# Patient Record
Sex: Female | Born: 1985
Health system: Southern US, Community
[De-identification: ages and names within clinical notes are randomized; demographics above are authoritative.]

## PROBLEM LIST (undated history)

## (undated) DIAGNOSIS — E669 Obesity, unspecified: Secondary | ICD-10-CM

## (undated) DIAGNOSIS — T4145XA Adverse effect of unspecified anesthetic, initial encounter: Secondary | ICD-10-CM

## (undated) DIAGNOSIS — T8859XA Other complications of anesthesia, initial encounter: Secondary | ICD-10-CM

## (undated) DIAGNOSIS — R21 Rash and other nonspecific skin eruption: Secondary | ICD-10-CM

## (undated) DIAGNOSIS — J45909 Unspecified asthma, uncomplicated: Secondary | ICD-10-CM

## (undated) HISTORY — PX: CHOLECYSTECTOMY: SHX55

## (undated) HISTORY — DX: Obesity, unspecified: E66.9

---

## 2006-11-06 ENCOUNTER — Observation Stay: Payer: Self-pay | Admitting: Obstetrics & Gynecology

## 2007-03-20 ENCOUNTER — Inpatient Hospital Stay: Payer: Self-pay | Admitting: Unknown Physician Specialty

## 2007-07-29 ENCOUNTER — Emergency Department: Payer: Self-pay | Admitting: Internal Medicine

## 2007-11-11 ENCOUNTER — Emergency Department: Payer: Self-pay

## 2008-11-03 ENCOUNTER — Emergency Department: Payer: Self-pay | Admitting: Emergency Medicine

## 2009-10-10 ENCOUNTER — Ambulatory Visit: Payer: Self-pay | Admitting: Family Medicine

## 2010-01-03 ENCOUNTER — Observation Stay: Payer: Self-pay | Admitting: Obstetrics and Gynecology

## 2010-04-03 ENCOUNTER — Observation Stay: Payer: Self-pay | Admitting: Obstetrics and Gynecology

## 2010-04-07 IMAGING — US US OB < 14 WEEKS
1 series · 17 of 19 positions shown · non-contrast
Comparison: none

REASON FOR EXAM: viability   dating   CALL  [id number]
COMMENTS:

[Series 1: us ob < 14 weeks · 17 of 19 slices shown]
[im 1/19]
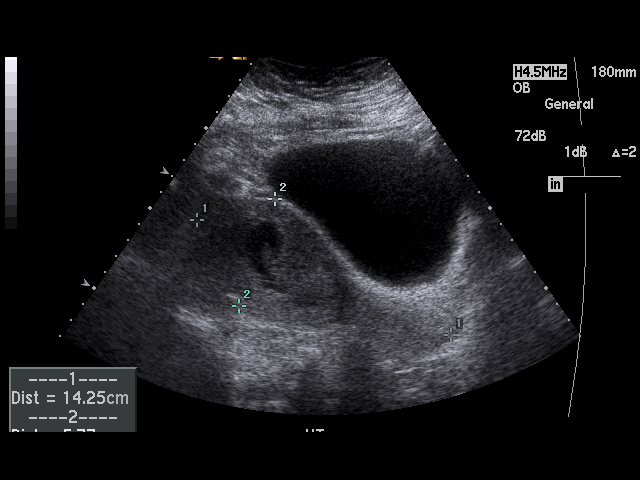
[im 2/19]
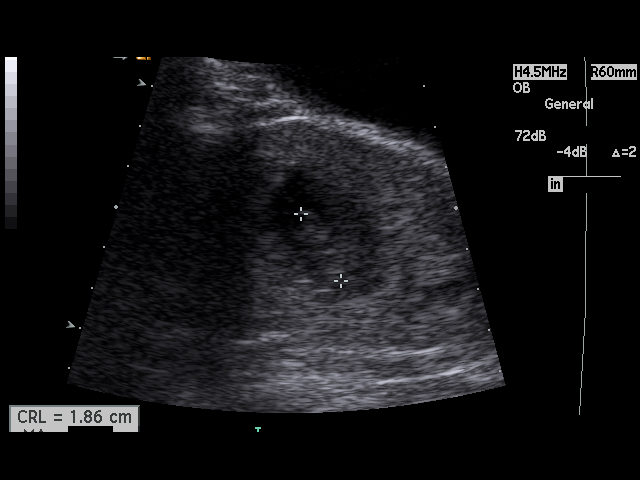
[im 3/19]
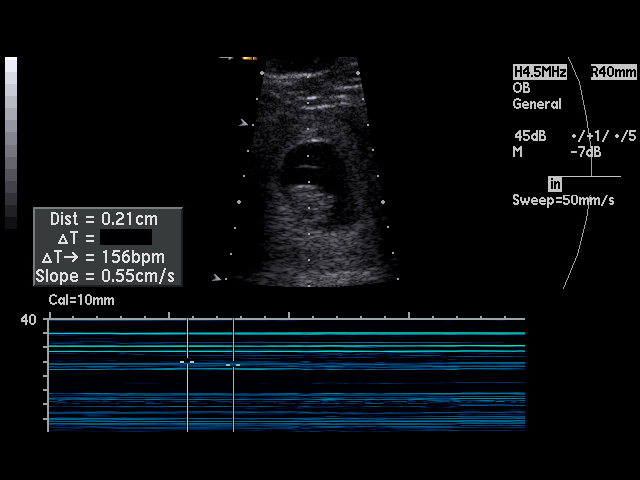
[im 4/19]
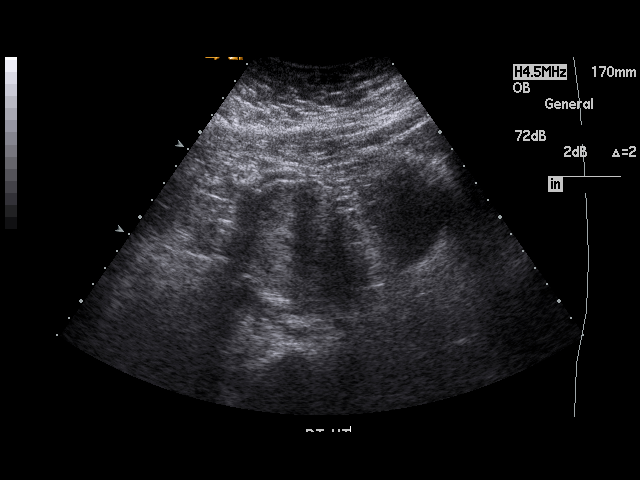
[im 6/19]
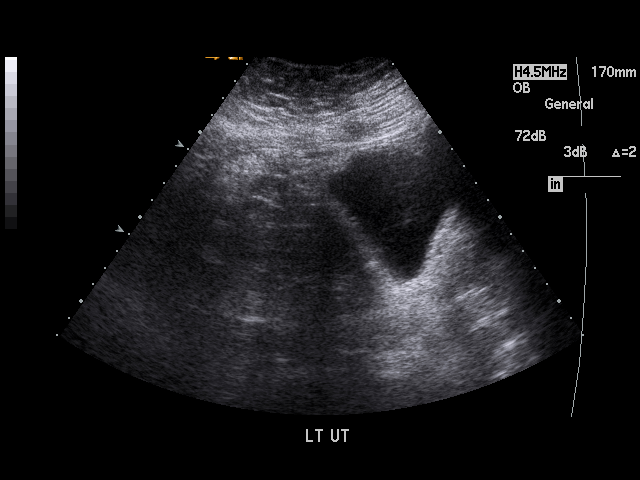
[im 7/19]
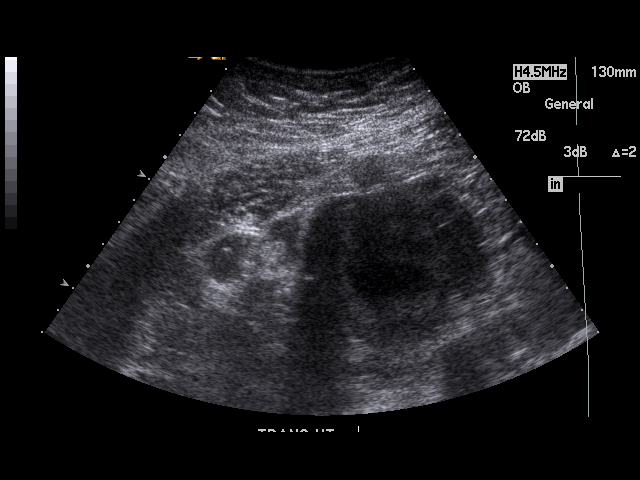
[im 8/19]
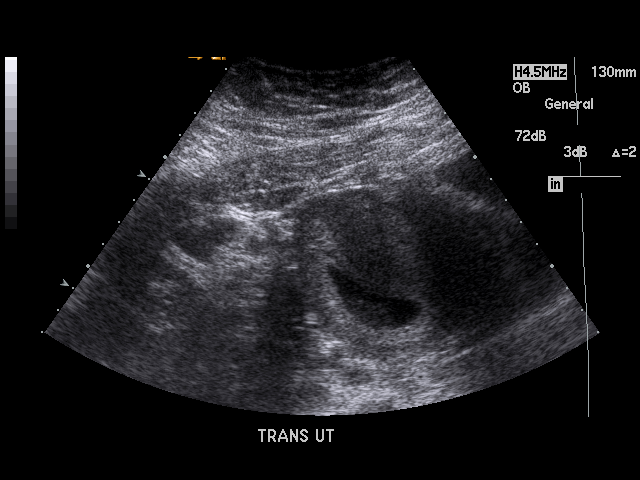
[im 9/19]
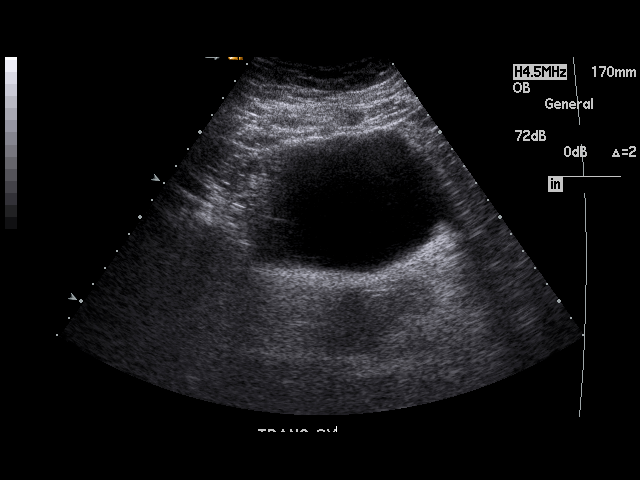
[im 10/19]
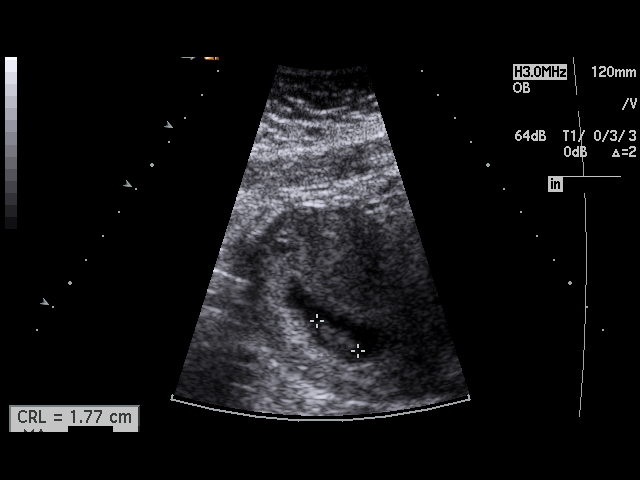
[im 11/19]
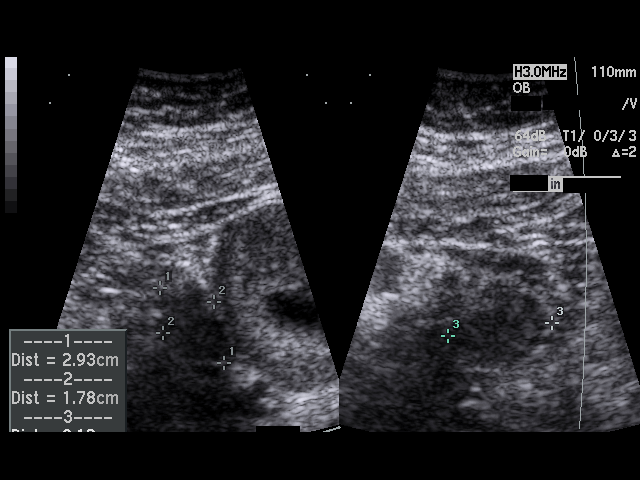
[im 12/19]
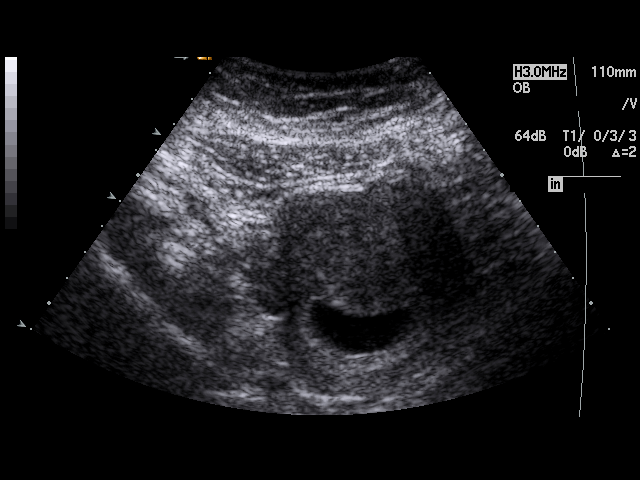
[im 13/19]
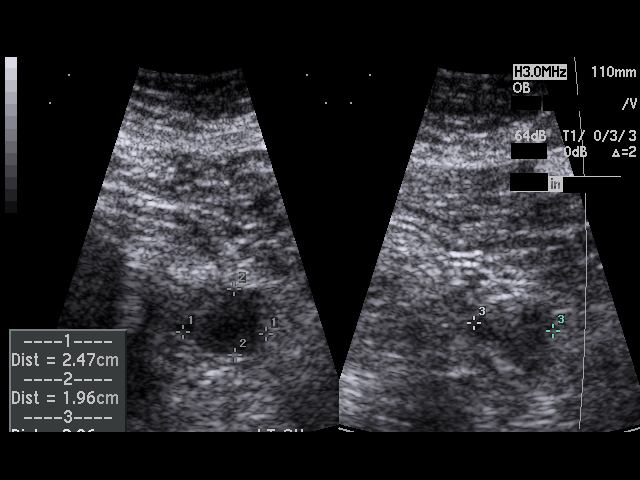
[im 14/19]
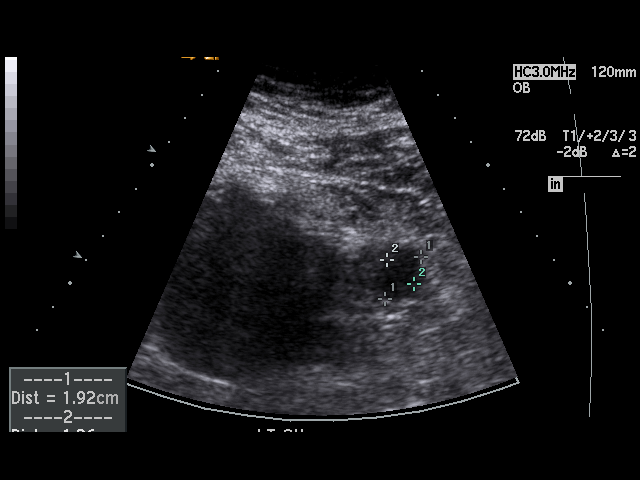
[im 16/19]
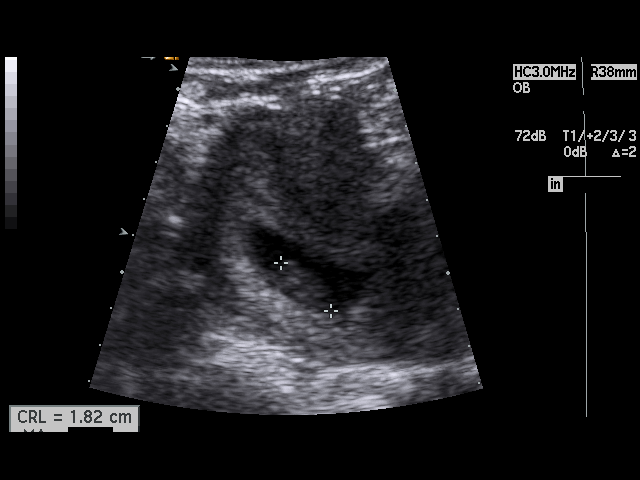
[im 17/19]
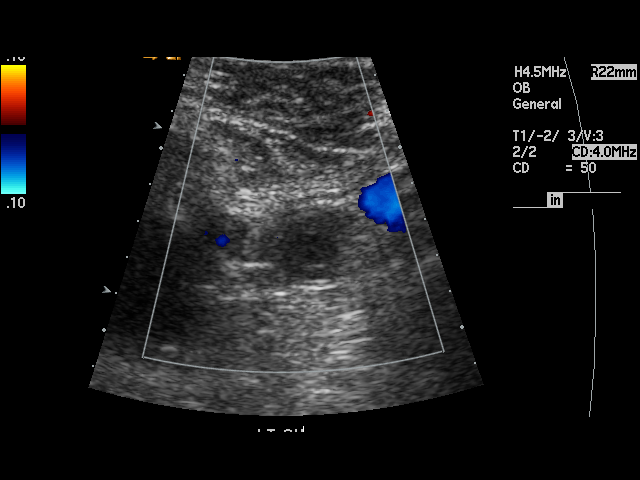
[im 18/19]
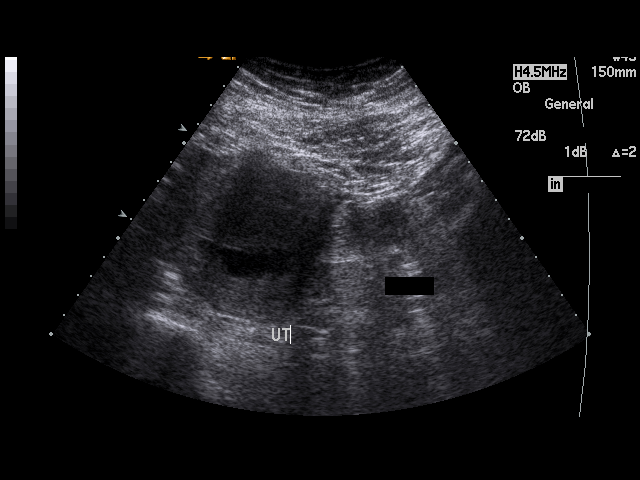
[im 19/19]
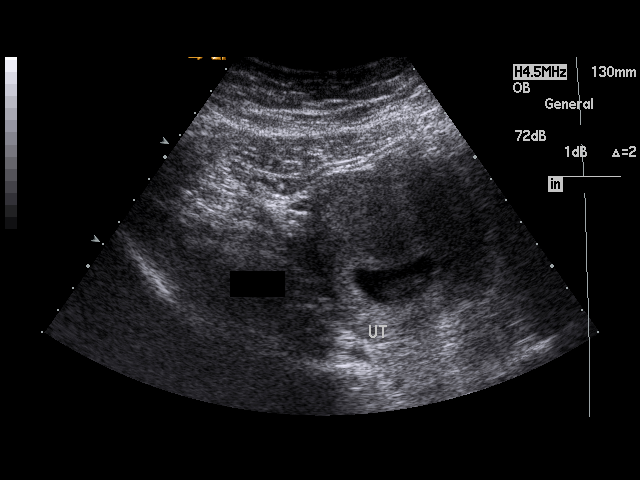

[17 of 19 positions shown; findings below may reference images not displayed]

PROCEDURE:     US  - US OB LESS THAN 14 WEEKS  - October 10, 2009  [DATE]

RESULT:

An intrauterine gestational sac is appreciated. A fetal pole is identified
with an estimated fetal heart rate of 156 beats per minute. Crown-rump
length is 1.82 cm which corresponds to an estimated gestational age of 8
weeks-4 days. EDC per ultrasound is 04/20/2010. The uterus otherwise
demonstrates a homogeneous echotexture. Evaluation of the ovaries
demonstrates no sonographic abnormalities. There is no evidence of pelvic
free fluid, loculated fluid collections nor masses.
IMPRESSION: 1. Single viable intrauterine pregnancy as described above.

Thank you for the opportunity to contribute to the care of your patient.

ADDENDUM:  11-20-09

Corrected EDC per ultrasound is 05/20/10

## 2010-04-16 ENCOUNTER — Observation Stay: Payer: Self-pay

## 2010-05-12 ENCOUNTER — Ambulatory Visit: Payer: Self-pay | Admitting: Obstetrics and Gynecology

## 2010-05-13 ENCOUNTER — Inpatient Hospital Stay: Payer: Self-pay | Admitting: Obstetrics and Gynecology

## 2010-07-25 LAB — HM PAP SMEAR: HM Pap smear: NORMAL

## 2012-07-09 ENCOUNTER — Emergency Department: Payer: Self-pay | Admitting: Unknown Physician Specialty

## 2012-07-09 LAB — COMPREHENSIVE METABOLIC PANEL
Anion Gap: 9 (ref 7–16)
Bilirubin,Total: 0.3 mg/dL (ref 0.2–1.0)
Calcium, Total: 9.1 mg/dL (ref 8.5–10.1)
Chloride: 107 mmol/L (ref 98–107)
Co2: 24 mmol/L (ref 21–32)
Creatinine: 0.72 mg/dL (ref 0.60–1.30)
EGFR (African American): >60
EGFR (Non-African Amer.): >60
Osmolality: 280 (ref 275–301)
Potassium: 3.8 mmol/L (ref 3.5–5.1)
SGPT (ALT): 44 U/L (ref 12–78)
Sodium: 140 mmol/L (ref 136–145)
Total Protein: 7.3 g/dL (ref 6.4–8.2)

## 2012-07-09 LAB — CBC
HCT: 41.8 % (ref 35.0–47.0)
HGB: 14.4 g/dL (ref 12.0–16.0)
MCH: 29.4 pg (ref 26.0–34.0)
MCHC: 34.5 g/dL (ref 32.0–36.0)
RBC: 4.91 10*6/uL (ref 3.80–5.20)

## 2012-07-09 LAB — LIPASE, BLOOD: Lipase: 282 U/L (ref 73–393)

## 2012-07-13 ENCOUNTER — Ambulatory Visit: Payer: Self-pay | Admitting: Surgery

## 2012-07-13 LAB — PREGNANCY, URINE: Pregnancy Test, Urine: NEGATIVE m[IU]/mL

## 2012-07-17 LAB — PATHOLOGY REPORT

## 2013-01-04 IMAGING — US ABDOMEN ULTRASOUND LIMITED
1 series · 13 of 25 positions shown · non-contrast
Comparison: none

REASON FOR EXAM: RUQ/EPIGASTRIC PAIN
COMMENTS:   Body Site: GB and Fossa, CBD, Head of Pancreas

[Series 1: abdomen ultrasound limited · 0.30mm/px · 13 of 47 slices shown]
[im 1/47]
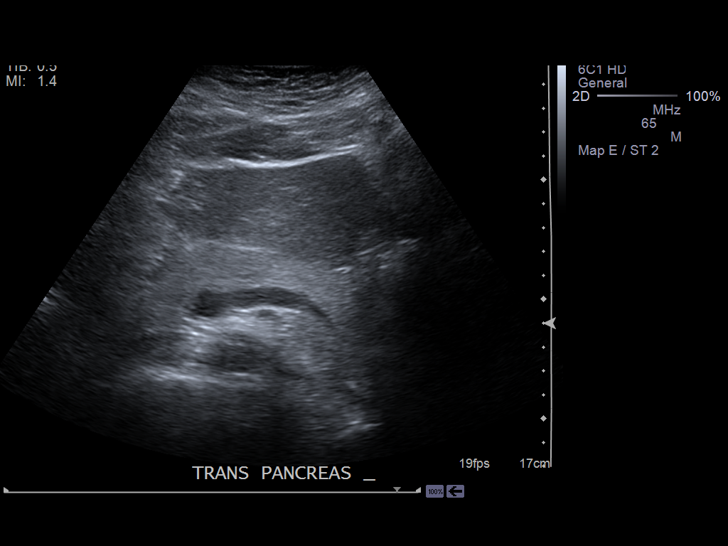
[im 4/47]
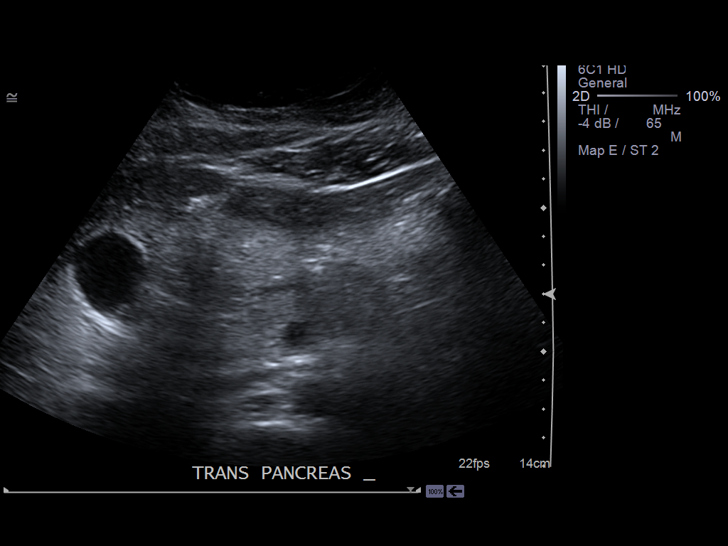
[im 8/47]
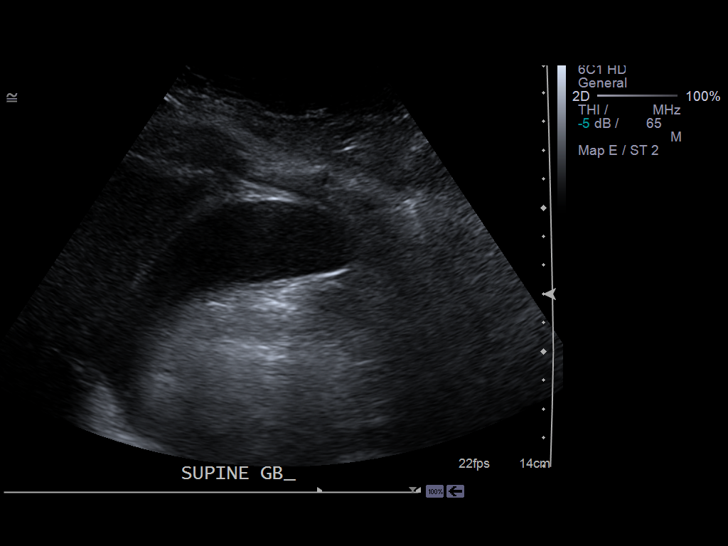
[im 12/47]
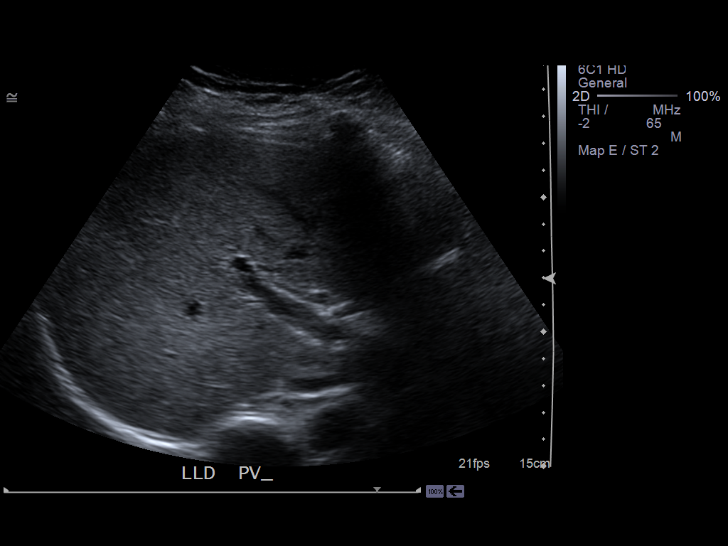
[im 16/47]
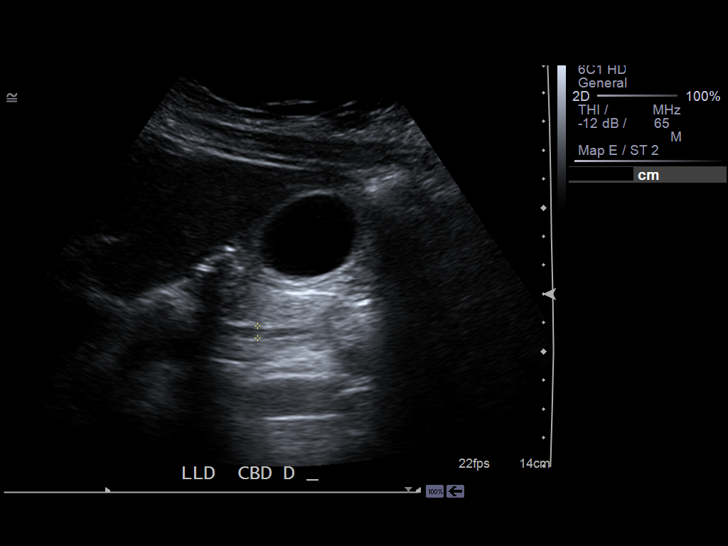
[im 20/47]
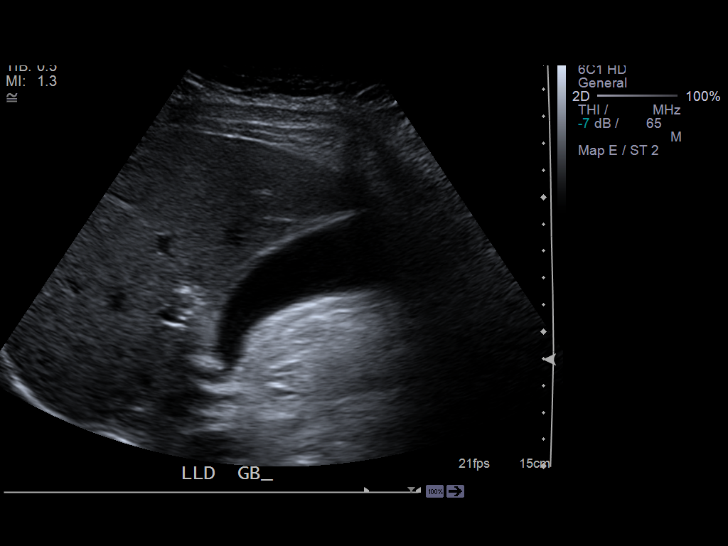
[im 24/47]
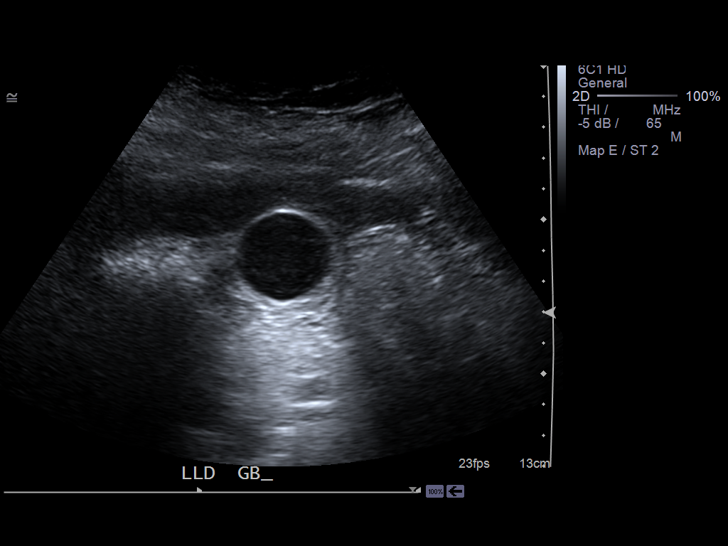
[im 27/47]
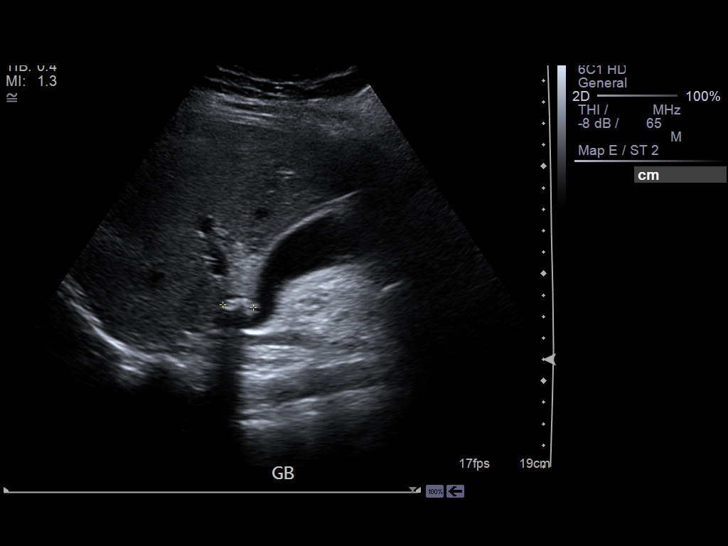
[im 31/47]
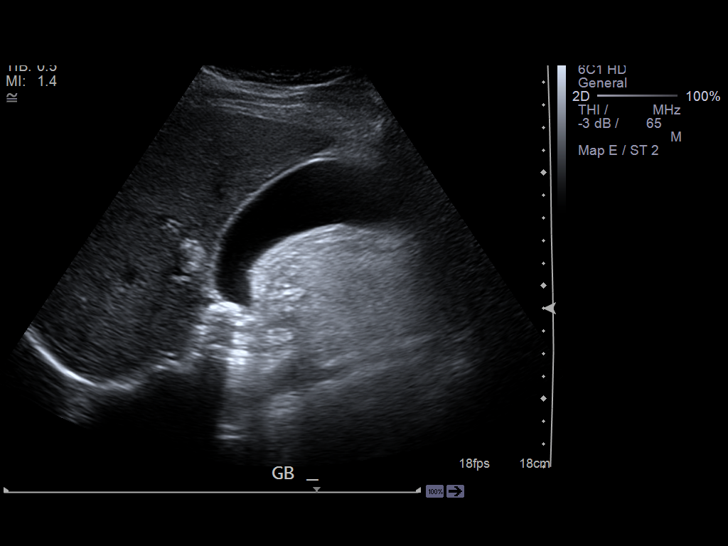
[im 35/47]
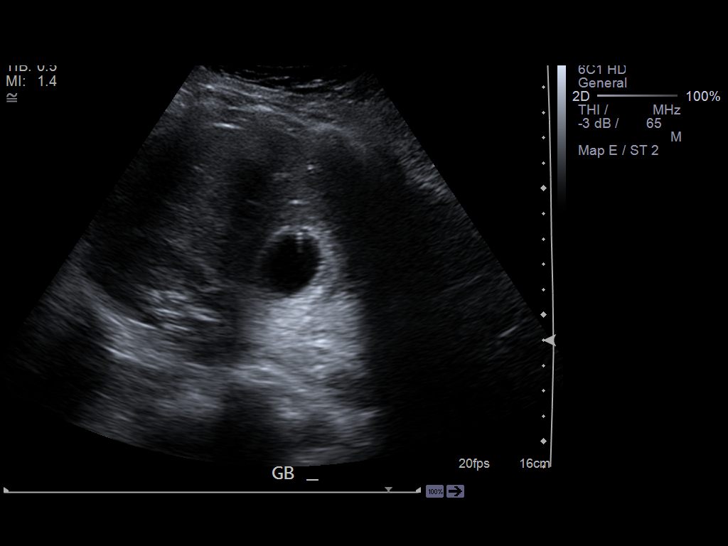
[im 39/47]
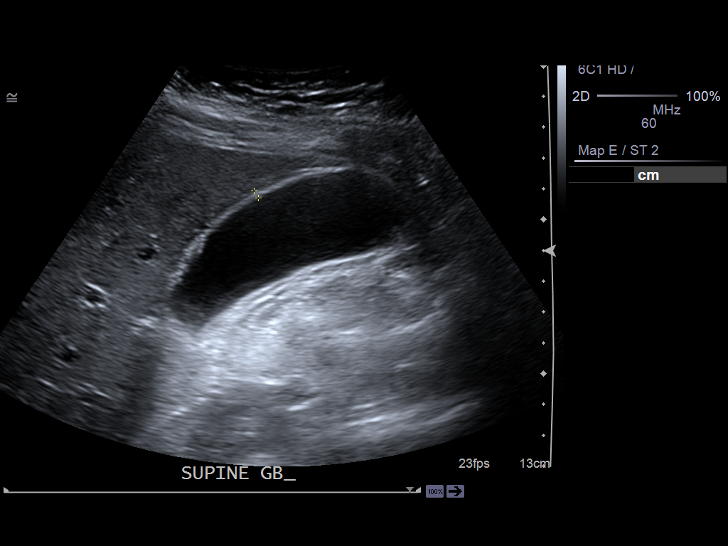
[im 43/47]
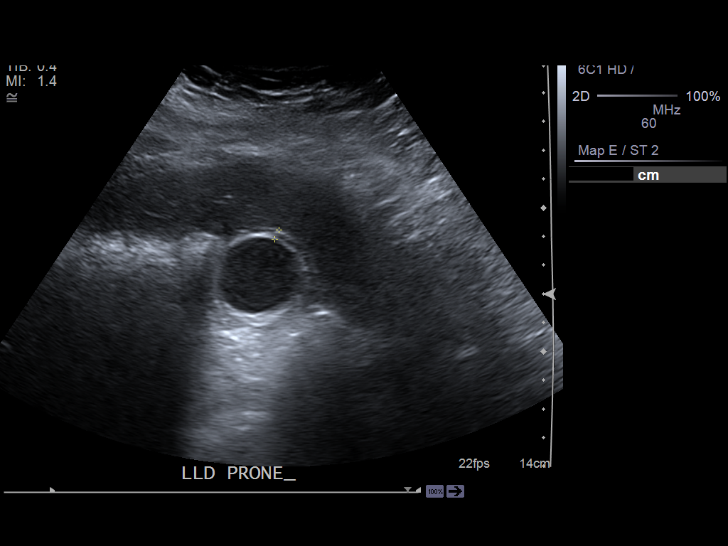
[im 47/47]
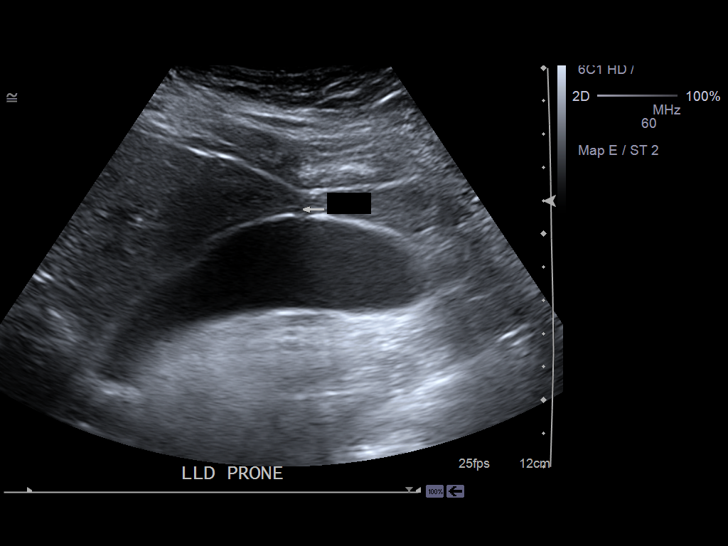

[13 of 25 positions shown; findings below may reference images not displayed]

PROCEDURE:     US  - US ABDOMEN LIMITED SURVEY  - July 09, 2012  [DATE]

RESULT:     A limited right upper quadrant ultrasound was performed.

The gallbladder is adequately distended. There are nonmobile gallstones
demonstrated. A 1.4 cm stone is demonstrated in the gallbladder neck. There
is borderline gallbladder wall thickening and there is a trace of
pericholecystic fluid. A sonographic Murphy's sign could not be assessed due
to the patient's medicated status.

The common bile duct is normal at 4.5 mm. The pancreatic head appears
normal. The pancreatic duct is visible but not abnormally distended at under
4 mm. The observed portions of the liver are normal. Portal venous flow is
normal in direction toward the liver.
IMPRESSION: 1. There are multiple nonmobile stones present. A stone is lodged in the
gallbladder neck. There is mild gallbladder wall thickening and likely a
trace of pericholecystic fluid.
2. The common bile duct and pancreas are normal where visualized.

A preliminary report was called to the [HOSPITAL] the
conclusion of the study.

[REDACTED]

## 2014-07-25 ENCOUNTER — Ambulatory Visit (INDEPENDENT_AMBULATORY_CARE_PROVIDER_SITE_OTHER): Payer: Commercial Managed Care - PPO | Admitting: Family Medicine

## 2014-07-25 ENCOUNTER — Encounter: Payer: Self-pay | Admitting: Family Medicine

## 2014-07-25 VITALS — BP 120/84 | HR 92 | Temp 98.1°F | Resp 16 | Ht 67.25 in | Wt 241.0 lb

## 2014-07-25 DIAGNOSIS — R059 Cough, unspecified: Secondary | ICD-10-CM

## 2014-07-25 DIAGNOSIS — M722 Plantar fascial fibromatosis: Secondary | ICD-10-CM

## 2014-07-25 DIAGNOSIS — R058 Other specified cough: Secondary | ICD-10-CM | POA: Insufficient documentation

## 2014-07-25 DIAGNOSIS — E669 Obesity, unspecified: Secondary | ICD-10-CM | POA: Insufficient documentation

## 2014-07-25 DIAGNOSIS — R05 Cough: Secondary | ICD-10-CM

## 2014-07-25 MED ORDER — MELOXICAM 15 MG PO TABS: 15.0000 mg | ORAL_TABLET | Freq: Every day | ORAL | Status: DC

## 2014-07-25 MED ORDER — ALBUTEROL SULFATE HFA 108 (90 BASE) MCG/ACT IN AERS: 2.0000 | INHALATION_SPRAY | Freq: Four times a day (QID) | RESPIRATORY_TRACT | Status: DC | PRN

## 2014-07-25 MED ORDER — ALBUTEROL SULFATE (2.5 MG/3ML) 0.083% IN NEBU
2.5000 mg | INHALATION_SOLUTION | Freq: Once | RESPIRATORY_TRACT | Status: AC
  Administered 2014-07-25: 2.5 mg via RESPIRATORY_TRACT

## 2014-07-25 NOTE — Patient Instructions (Signed)
Schedule your complete physical at your convenience- we can do a pap at this time Start Claritin or Zyrtec daily- store brand works just as well- to decrease post nasal drip and airway inflammation Use the inhaler- 2 puffs every 6 hrs- as needed for cough, wheezing, shortness of breath Start the Mobic daily- w/ food- x10 days for inflammation and then as needed Roll your feet over the frozen water bottle daily to improve inflammation and stretch the arch Wear good supportive shoes Try and make healthy food choices and get regular activity to prevent additional weight gain Call with any questions or concerns Welcome!  We're glad to have you!

## 2014-07-25 NOTE — Progress Notes (Signed)
Pre visit review using our clinic review tool, if applicable. No additional management support is needed unless otherwise documented below in the visit note. 

## 2014-07-25 NOTE — Progress Notes (Signed)
   Subjective:    Patient ID: [name] Johnston, female    DOB: Sep 18, 1986, 28 y.o.   MRN: [id number]  HPI CPE- no previous PCP.  Last pap ~4 yrs ago.  Cough- sxs started 3 weeks ago.  'feels like i'm coughing up cotton balls'.  Intermittently productive.  No fevers.  'other than the cough, it's like I'm not sick at all'.  No known sick contacts.  No hx of seasonal allergies.  Bilaterally foot pain- most noticeable after prolonged sitting.  Painful to stand.  Pt reports feet will 'swell and turn purple'.  Discomfort is diffuse.  sxs started ~1 yr ago.  Birth control- 8/14 had Implanon switched from R arm to L arm.  Since then reports increased weight gain, 'i've gained 50 lbs' and irregular pelvic cramping and pain.  Not having periods w/ Implanon.   Review of Systems For ROS see HPI     Objective:   Physical Exam  Vitals reviewed. Constitutional: She is oriented to person, place, and time. She appears well-developed and well-nourished. No distress.  HENT:  Head: Normocephalic and atraumatic.  Right Ear: Tympanic membrane normal.  Left Ear: Tympanic membrane normal.  Nose: Mucosal edema and rhinorrhea present. Right sinus exhibits no maxillary sinus tenderness and no frontal sinus tenderness. Left sinus exhibits no maxillary sinus tenderness and no frontal sinus tenderness.  Mouth/Throat: Mucous membranes are normal. Posterior oropharyngeal erythema (w/ PND) present.  Eyes: Conjunctivae and EOM are normal. Pupils are equal, round, and reactive to light.  Neck: Normal range of motion. Neck supple.  Cardiovascular: Normal rate, regular rhythm and normal heart sounds.   Pulmonary/Chest: Effort normal and breath sounds normal. No respiratory distress. She has no wheezes. She has no rales.  Musculoskeletal: She exhibits tenderness (TTP over insertion of plantar fascia bilaterally). She exhibits no edema.  Lymphadenopathy:    She has no cervical adenopathy.  Neurological: She is alert and  oriented to person, place, and time. She has normal reflexes.  Skin: Skin is warm and dry.  Psychiatric: She has a normal mood and affect. Her behavior is normal.          Assessment & Plan:

## 2014-07-28 NOTE — Assessment & Plan Note (Addendum)
New.  Suspect this is due to untreated nasal allergies.  Cough improved s/p neb tx- consistent w/ RAD.  Start OTC antihistamine.  Albuterol inhaler prn.  Reviewed supportive care and red flags that should prompt return.  Pt expressed understanding and is in agreement w/ plan.

## 2014-07-28 NOTE — Assessment & Plan Note (Signed)
New.  Start scheduled NSAIDs.  Ice and stretching as shown.  Reviewed supportive care and red flags that should prompt return.  Pt expressed understanding and is in agreement w/ plan.

## 2014-07-28 NOTE — Assessment & Plan Note (Signed)
New.  Encouraged regular, aerobic activity for 30 minutes at least 4x/week and monitoring caloric intake w/ the help of [employer]itness[employer] app. Check labs to risk stratify.  Will follow.

## 2014-08-22 ENCOUNTER — Ambulatory Visit: Payer: Self-pay | Admitting: Family Medicine

## 2014-11-20 ENCOUNTER — Other Ambulatory Visit (HOSPITAL_COMMUNITY)
Admission: RE | Admit: 2014-11-20 | Discharge: 2014-11-20 | Disposition: A | Discharge status: Home / Self Care | Payer: Commercial Managed Care - PPO | Source: Ambulatory Visit | Attending: Family Medicine | Admitting: Family Medicine

## 2014-11-20 ENCOUNTER — Ambulatory Visit (INDEPENDENT_AMBULATORY_CARE_PROVIDER_SITE_OTHER): Payer: Commercial Managed Care - PPO | Admitting: Family Medicine

## 2014-11-20 ENCOUNTER — Encounter: Payer: Self-pay | Admitting: Family Medicine

## 2014-11-20 VITALS — BP 120/82 | HR 89 | Temp 98.0°F | Resp 16 | Ht 67.0 in | Wt 249.4 lb

## 2014-11-20 DIAGNOSIS — Z01411 Encounter for gynecological examination (general) (routine) with abnormal findings: Secondary | ICD-10-CM | POA: Insufficient documentation

## 2014-11-20 DIAGNOSIS — Z01419 Encounter for gynecological examination (general) (routine) without abnormal findings: Secondary | ICD-10-CM

## 2014-11-20 DIAGNOSIS — Z1151 Encounter for screening for human papillomavirus (HPV): Secondary | ICD-10-CM | POA: Diagnosis present

## 2014-11-20 DIAGNOSIS — Z124 Encounter for screening for malignant neoplasm of cervix: Secondary | ICD-10-CM

## 2014-11-20 DIAGNOSIS — M25579 Pain in unspecified ankle and joints of unspecified foot: Secondary | ICD-10-CM

## 2014-11-20 NOTE — Patient Instructions (Signed)
Follow up in 1 year or as needed We'll notify you of your lab results and make any changes if needed Try and make healthy food choices and get regular exercise We'll call you with Sports Med appt Call with any questions or concerns Happy New Year!!!

## 2014-11-20 NOTE — Progress Notes (Signed)
Pre visit review using our clinic review tool, if applicable. No additional management support is needed unless otherwise documented below in the visit note. 

## 2014-11-20 NOTE — Progress Notes (Signed)
   Subjective:    Patient ID: [name] DaubBetsy R Johnston, female    DOB: 1986-06-05, 29 y.o.   MRN: [id number]  HPI CPE- due for pap.  Foot/ankle pain- pt is unable to report which ankle but intermittently will have episodes where she will step down and 'it feels like my ankle is going to crack in half'.  Audible pop/crack w/ each step when pt is having episode.  Will spontaneously resolve and then return w/o injury or inciting incident.   Review of Systems Patient reports no vision/ hearing changes, adenopathy,fever, weight change,  persistant/recurrent hoarseness , swallowing issues, chest pain, palpitations, edema, persistant/recurrent cough, hemoptysis, dyspnea (rest/exertional/paroxysmal nocturnal), gastrointestinal bleeding (melena, rectal bleeding), abdominal pain, significant heartburn, bowel changes, GU symptoms (dysuria, hematuria, incontinence), Gyn symptoms (abnormal  bleeding, pain),  syncope, focal weakness, memory loss, numbness & tingling, skin/hair/nail changes, abnormal bruising or bleeding, anxiety, or depression.     Objective:   Physical Exam  General Appearance:    Alert, cooperative, no distress, appears stated age, obese  Head:    Normocephalic, without obvious abnormality, atraumatic  Eyes:    PERRL, conjunctiva/corneas clear, EOM's intact, fundi    benign, both eyes  Ears:    Normal TM's and external ear canals, both ears  Nose:   Nares normal, septum midline, mucosa normal, no drainage    or sinus tenderness  Throat:   Lips, mucosa, and tongue normal; teeth and gums normal  Neck:   Supple, symmetrical, trachea midline, no adenopathy;    Thyroid: no enlargement/tenderness/nodules  Back:     Symmetric, no curvature, ROM normal, no CVA tenderness  Lungs:     Clear to auscultation bilaterally, respirations unlabored  Chest Wall:    No tenderness or deformity   Heart:    Regular rate and rhythm, S1 and S2 normal, no murmur, rub   or gallop  Breast Exam:    No tenderness,  masses, or nipple abnormality  Abdomen:     Soft, non-tender, bowel sounds active all four quadrants,    no masses, no organomegaly  Genitalia:    External genitalia normal, cervix normal in appearance, no CMT, uterus in normal size and position, adnexa w/out mass or tenderness, mucosa pink and moist, no lesions or discharge present  Rectal:    Normal external appearance  Extremities:   Extremities normal, atraumatic, no cyanosis or edema  Pulses:   2+ and symmetric all extremities  Skin:   Skin color, texture, turgor normal, no rashes or lesions  Lymph nodes:   Cervical, supraclavicular, and axillary nodes normal  Neurologic:   CNII-XII intact, normal strength, sensation and reflexes    throughout          Assessment & Plan:

## 2014-11-21 DIAGNOSIS — M25579 Pain in unspecified ankle and joints of unspecified foot: Secondary | ICD-10-CM | POA: Insufficient documentation

## 2014-11-21 LAB — LIPID PANEL
Cholesterol: 182 mg/dL (ref 0–200)
HDL: 46 mg/dL (ref >39.00)
LDL CALC: 102 mg/dL — AB (ref 0–99)
NONHDL: 136
TRIGLYCERIDES: 172 mg/dL — AB (ref 0.0–149.0)
Total CHOL/HDL Ratio: 4
VLDL: 34.4 mg/dL (ref 0.0–40.0)

## 2014-11-21 LAB — CBC WITH DIFFERENTIAL/PLATELET
BASOS PCT: 2.1 % (ref 0.0–3.0)
Basophils Absolute: 0.2 10*3/uL — ABNORMAL HIGH (ref 0.0–0.1)
Eosinophils Absolute: 0.1 10*3/uL (ref 0.0–0.7)
Eosinophils Relative: 0.9 % (ref 0.0–5.0)
HCT: 42.5 % (ref 36.0–46.0)
Hemoglobin: 14.4 g/dL (ref 12.0–15.0)
LYMPHS PCT: 34.7 % (ref 12.0–46.0)
Lymphs Abs: 3 10*3/uL (ref 0.7–4.0)
MCHC: 33.9 g/dL (ref 30.0–36.0)
MCV: 84.7 fl (ref 78.0–100.0)
MONO ABS: 0.5 10*3/uL (ref 0.1–1.0)
Monocytes Relative: 5.6 % (ref 3.0–12.0)
Neutro Abs: 5 10*3/uL (ref 1.4–7.7)
Neutrophils Relative %: 56.7 % (ref 43.0–77.0)
Platelets: 429 10*3/uL — ABNORMAL HIGH (ref 150.0–400.0)
RBC: 5.02 Mil/uL (ref 3.87–5.11)
RDW: 13.6 % (ref 11.5–15.5)
WBC: 8.7 10*3/uL (ref 4.0–10.5)

## 2014-11-21 LAB — BASIC METABOLIC PANEL
BUN: 12 mg/dL (ref 6–23)
CALCIUM: 9.9 mg/dL (ref 8.4–10.5)
CO2: 18 mEq/L — ABNORMAL LOW (ref 19–32)
CREATININE: 0.76 mg/dL (ref 0.40–1.20)
Chloride: 109 mEq/L (ref 96–112)
GFR: 95.78 mL/min (ref >60.00)
Glucose, Bld: 86 mg/dL (ref 70–99)
Potassium: 6 mEq/L — ABNORMAL HIGH (ref 3.5–5.1)
SODIUM: 139 meq/L (ref 135–145)

## 2014-11-21 LAB — HEPATIC FUNCTION PANEL
ALBUMIN: 4.2 g/dL (ref 3.5–5.2)
ALT: 53 U/L — ABNORMAL HIGH (ref 0–35)
AST: 47 U/L — ABNORMAL HIGH (ref 0–37)
Alkaline Phosphatase: 99 U/L (ref 39–117)
BILIRUBIN DIRECT: 0.1 mg/dL (ref 0.0–0.3)
BILIRUBIN TOTAL: 0.4 mg/dL (ref 0.2–1.2)
TOTAL PROTEIN: 7.6 g/dL (ref 6.0–8.3)

## 2014-11-21 LAB — VITAMIN D 25 HYDROXY (VIT D DEFICIENCY, FRACTURES): VITD: 34.14 ng/mL (ref 30.00–100.00)

## 2014-11-21 LAB — TSH: TSH: 1.05 u[IU]/mL (ref 0.35–4.50)

## 2014-11-21 NOTE — Assessment & Plan Note (Signed)
Pap collected. 

## 2014-11-21 NOTE — Assessment & Plan Note (Signed)
New.  Pt's sxs are intermittent but painful and debilitating when they occur.  Will refer to sports med.  Pt expressed understanding and is in agreement w/ plan.

## 2014-11-21 NOTE — Assessment & Plan Note (Signed)
Pt's PE WNL w/ exception of obesity.  Check labs.  Anticipatory guidance provided.  

## 2014-11-26 ENCOUNTER — Other Ambulatory Visit: Payer: Self-pay | Admitting: General Practice

## 2014-11-26 ENCOUNTER — Ambulatory Visit: Payer: Commercial Managed Care - PPO | Admitting: Family Medicine

## 2014-11-26 LAB — CYTOLOGY - PAP

## 2014-11-26 MED ORDER — FLUCONAZOLE 150 MG PO TABS
150.0000 mg | ORAL_TABLET | Freq: Once | ORAL | Status: DC
Start: 2014-11-26 — End: 2015-09-05

## 2014-11-29 ENCOUNTER — Encounter: Payer: Self-pay | Admitting: General Practice

## 2014-11-29 ENCOUNTER — Other Ambulatory Visit: Payer: Self-pay | Admitting: General Practice

## 2014-11-29 DIAGNOSIS — E875 Hyperkalemia: Secondary | ICD-10-CM

## 2014-11-29 DIAGNOSIS — R945 Abnormal results of liver function studies: Principal | ICD-10-CM

## 2014-11-29 DIAGNOSIS — R7989 Other specified abnormal findings of blood chemistry: Secondary | ICD-10-CM

## 2014-12-16 ENCOUNTER — Other Ambulatory Visit: Payer: Commercial Managed Care - PPO

## 2014-12-17 ENCOUNTER — Other Ambulatory Visit: Payer: Commercial Managed Care - PPO

## 2014-12-19 ENCOUNTER — Other Ambulatory Visit (INDEPENDENT_AMBULATORY_CARE_PROVIDER_SITE_OTHER): Payer: Commercial Managed Care - PPO

## 2014-12-19 ENCOUNTER — Encounter: Payer: Self-pay | Admitting: General Practice

## 2014-12-19 DIAGNOSIS — R945 Abnormal results of liver function studies: Principal | ICD-10-CM

## 2014-12-19 DIAGNOSIS — R7989 Other specified abnormal findings of blood chemistry: Secondary | ICD-10-CM

## 2014-12-19 DIAGNOSIS — E875 Hyperkalemia: Secondary | ICD-10-CM

## 2014-12-19 LAB — BASIC METABOLIC PANEL
BUN: 12 mg/dL (ref 6–23)
CO2: 22 mEq/L (ref 19–32)
Calcium: 9.4 mg/dL (ref 8.4–10.5)
Chloride: 107 mEq/L (ref 96–112)
Creatinine, Ser: 0.72 mg/dL (ref 0.40–1.20)
GFR: 101.89 mL/min (ref >60.00)
GLUCOSE: 91 mg/dL (ref 70–99)
Potassium: 4 mEq/L (ref 3.5–5.1)
SODIUM: 136 meq/L (ref 135–145)

## 2014-12-19 LAB — HEPATIC FUNCTION PANEL
ALT: 38 U/L — AB (ref 0–35)
AST: 18 U/L (ref 0–37)
Albumin: 3.8 g/dL (ref 3.5–5.2)
Alkaline Phosphatase: 97 U/L (ref 39–117)
BILIRUBIN DIRECT: 0.1 mg/dL (ref 0.0–0.3)
Total Bilirubin: 0.5 mg/dL (ref 0.2–1.2)
Total Protein: 7.2 g/dL (ref 6.0–8.3)

## 2015-02-18 NOTE — H&P (Signed)
PATIENT NAME:  [name] Johnston, [name] MR#:  [id number] DATE OF BIRTH:  17-Dec-1985  DATE OF ADMISSION:  07/13/2012  CHIEF COMPLAINT: Right upper quadrant pain.   HISTORY OF PRESENT ILLNESS: This is a patient who was recently seen in the Emergency Room with recurrent episodes of right upper quadrant pain associated with fatty food intolerance. Workup showed gallstones and likely biliary colic. She is here for elective laparoscopic cholecystectomy for control of her symptoms.   PAST MEDICAL HISTORY: None.   PAST SURGICAL HISTORY: Cesarean sections.   ALLERGIES: No known drug allergies.   MEDICATIONS: None.   FAMILY HISTORY: She has a sister who had a cholecystectomy recently.   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 She does not smoke or drink.   REVIEW OF SYSTEMS: Ten system review has been performed and negative with the exception of that mentioned in the history of present illness.   PHYSICAL EXAMINATION:   GENERAL: Healthy but obese female patient.   HEENT: No scleral icterus.   NECK: No palpable neck nodes.   CHEST: Clear to auscultation.   CARDIAC: Regular rate and rhythm.   ABDOMEN: Soft, nontender.   EXTREMITIES: No edema. Calves are nontender.   NEUROLOGIC: Grossly intact.   INTEGUMENT: No jaundice.   LABS/RADIOLOGIC STUDIES: Laboratory values are within normal limits. No liver function test elevation.   Ultrasound shows stones.   ASSESSMENT AND PLAN: This is a patient with symptomatic cholelithiasis here for elective laparoscopic cholecystectomy. The rationale for surgery has been discussed. The options of observation have been reviewed and the risks of bleeding, infection, recurrence of symptoms, failure to resolve her symptoms, open procedure, bile duct damage, bile duct leak, and retained common bile duct stone any of which could require further surgery and/or ERCP, stent, and papillotomy have been discussed with her. She understood and agreed to  proceed. ____________________________ Adah Salvageichard E. Excell Seltzerooper, MD rec:slb D: 07/12/2012 20:16:28 ET T: 07/13/2012 08:30:57 ET JOB#: [id number]  cc: Adah Salvageichard E. Excell Seltzerooper, MD, <Dictator> Lattie HawICHARD E Daja Shuping MD ELECTRONICALLY SIGNED 07/13/2012 12:16

## 2015-02-18 NOTE — Op Note (Signed)
PATIENT NAME:  [name] PriestHAMILTON, Lilac MR#:  [id number] DATE OF BIRTH:  12/10/1985  DATE OF PROCEDURE:  07/13/2012  PREOPERATIVE DIAGNOSIS: Biliary colic.  POSTOPERATIVE DIAGNOSIS: Biliary colic.  PROCEDURE PERFORMED: Laparoscopic cholecystectomy.  SURGEON: Dionne Miloichard Sharifah Champine, M.D.   ANESTHESIA: General with endotracheal tube.   INDICATIONS: This is a patient with recurrent episodes of right upper quadrant pain associated with fatty food intolerance and work-up showing gallstones. She has been in the emergency room once. Preoperatively we discussed the rationale for surgery, the options of observation, risk of bleeding, infection, recurrence of symptoms, failure to resolve her symptoms, open procedure, bile duct damage, bile duct leak, and retained common bile duct stone any of which could require further surgery and/or ERCP, stent, and papillotomy. This was all reviewed for her in the preop holding area. She understood and agreed to proceed.   FINDINGS: Single large gallstone in the gallbladder, not impacted. No inflammation in the area of the gallbladder.   DESCRIPTION OF PROCEDURE: The patient was induced to general anesthesia. VTE prophylaxis was in place. She was given IV antibiotics and then prepped and draped in a sterile fashion. A local anesthetic was infiltrated in skin and subcutaneous tissues around the supraumbilical area. An incision was made. Veress needle was placed. Pneumoperitoneum was obtained. 5 mm trocar port was placed. The abdominal cavity was explored and under direct vision a 10 mm epigastric port and two lateral 5 mm ports were placed. The gallbladder was placed on tension. The peritoneum over the infundibulum was incised bluntly. The cystic lymphatics were doubly clipped and divided and then the juncture of the cystic duct, infundibular gallbladder was well delineated. The cystic artery was doubly clipped and divided in two branches. This allowed for good visualization of the cystic  duct as it entered the infundibulum. Here it was doubly clipped and divided and the gallbladder was taken from the gallbladder fossa with electrocautery and passed out through the epigastric port site with the aid of an Endo Catch bag. The area was checked for hemostasis and found to be adequate. The area was irrigated with copious amounts of normal saline. There was no sign of bile leak, bowel injury or bleeding. Therefore, the camera was placed in the epigastric site to view back to the periumbilical site. There was no sign of adhesion or bowel injury. Pneumoperitoneum was released. All ports were removed. Fascial edges at the epigastric site were approximated with 0 Vicryl figure-of-eight sutures. 4-0 subcuticular Monocryl was used at all skin edges. At this point, because of the patient's Steri-Strip allergy, Dermabond was utilized on all four incisions.  The patient tolerated this procedure well. There were no complications. She was taken to the recovery room in stable condition to be discharged in the care of her family with followup in 10 days. ____________________________ Adah Salvageichard E. Excell Seltzerooper, MD rec:slb D: 07/13/2012 14:24:42 ET T: 07/13/2012 15:00:42 ET JOB#: [id number]  cc: Adah Salvageichard E. Excell Seltzerooper, MD, <Dictator> Lattie HawICHARD E Gabrielle Wakeland MD ELECTRONICALLY SIGNED 07/18/2012 21:01

## 2015-02-18 NOTE — Consult Note (Signed)
Brief Consult Note: Diagnosis: biliary colic.   Patient was seen by consultant.   Consult note dictated.   Recommend to proceed with surgery or procedure.   Discussed with Attending MD.   Comments: min pain now, mult attacks wants to go home and sch as oupt plan to see n office this week discussed with Dr Enedina FinnerGoli.  Electronic Signatures: Lattie Hawooper, Jontay Maston E (MD)  (Signed 08-Sep-13 08:09)  Authored: Brief Consult Note   Last Updated: 08-Sep-13 08:09 by Lattie Hawooper, Antavius Sperbeck E (MD)

## 2015-02-18 NOTE — Consult Note (Signed)
PATIENT NAME:  [name] Johnston, [name] Johnston MR#:  [id number] DATE OF BIRTH:  1986-01-05  DATE OF CONSULTATION:  07/09/2012  REFERRING PHYSICIAN:   CONSULTING PHYSICIAN:  Adah Salvageichard E. Excell Seltzerooper, MD  CHIEF COMPLAINT: Right upper quadrant pain.   HISTORY OF PRESENT ILLNESS: This is a patient with one month of recurrent episodes of right upper quadrant pain associated with fatty food intolerance that typically lasts less than two hours and goes away spontaneously. She describes, however, one that started last night and did not go away and she had nausea which she had never had before. She did not vomit. She came to the Emergency Room because of the pain and nausea. She describes her pain in the right upper quadrant, dull like in nature, and radiates to the back and shoulder. She denies fevers or chills. No melena or hematochezia.   PAST MEDICAL HISTORY: None.   PAST SURGICAL HISTORY: Cesarean section.   ALLERGIES: None.   MEDICATIONS: None.   FAMILY HISTORY: A sister who had cholecystitis or cholecystectomy in the recent past.   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 Does not smoke or drink.   REVIEW OF SYSTEMS: 10 system review is performed and negative with the exception of that mentioned in the history of present illness.   PHYSICAL EXAMINATION:   GENERAL: Healthy appearing Caucasian female patient. BMI 29.   VITAL SIGNS: Temperature 98.2, pulse 67, respirations 18, blood pressure 123/65.   HEENT: No scleral icterus.   NECK: No palpable neck nodes.   CHEST: Clear to auscultation.   CARDIAC: Regular rate and rhythm.   ABDOMEN: Soft, minimally tender in the right upper quadrant with a negative Murphy's sign.   EXTREMITIES: Without edema. Calves are nontender.   NEUROLOGIC: Grossly intact.   INTEGUMENTARY: No jaundice.   LABORATORY, DIAGNOSTIC, AND RADIOLOGICAL DATA: White blood cell count 12.0, hemoglobin and hematocrit 14 and 42, platelet count of 321. Liver function tests are  within normal limits.   An ultrasound shows multiple stones, one lodged in the neck of the gallbladder, and some gallbladder wall thickening.   ASSESSMENT AND PLAN: This is a patient with symptomatic cholelithiasis, now possible early acute cholecystitis. I offered admission to the hospital and surgery in the next day or two. The patient is concerned about work and family situation and did not want to come in the hospital. I discussed with the patient and with Dr. Enedina FinnerGoli the need for pain medications and nausea medications and that she could call me in the morning, see her in the office and schedule this electively. I reminded her that if she were to worsen she should return to the Emergency Room and she was in full agreement with this plan. I described for her the procedure itself and the risks of bleeding, infection, recurrence of symptoms, conversion to an open procedure, bile duct damage, bile duct leak, retained common bile duct stone, any of which could require further surgery and/or ERCP, stent, and papillotomy and I asked her to follow-up in my office tomorrow.   ____________________________ Adah Salvageichard E. Excell Seltzerooper, MD rec:drc D: 07/09/2012 08:17:12 ET T: 07/09/2012 12:07:21 ET JOB#: [id number] cc: Adah Salvageichard E. Excell Seltzerooper, MD, <Dictator> Lattie HawICHARD E Undray Allman MD ELECTRONICALLY SIGNED 07/09/2012 14:14

## 2015-07-14 ENCOUNTER — Telehealth: Payer: Self-pay | Admitting: Family Medicine

## 2015-07-14 NOTE — Telephone Encounter (Signed)
Received, w/ Dr. Beverely Low for signature. JG//CMA

## 2015-07-14 NOTE — Telephone Encounter (Signed)
Relation to ZO:XWRU Call back number:[phone]   Reason for call:  Patient faxed over physical forms last week and would like to know if forms were received. Patient states she faxed them to [phone] and [phone]

## 2015-07-17 NOTE — Telephone Encounter (Signed)
Done

## 2015-07-17 NOTE — Telephone Encounter (Signed)
LM for pt informing her that form is ready at our front desk. Copy sent for scanning. JG//CMA

## 2015-07-17 NOTE — Telephone Encounter (Signed)
Patient received VM stating physical forms are ready for pick up. Patient would like forms fax to [phone].

## 2015-08-05 ENCOUNTER — Encounter: Payer: Self-pay | Admitting: Family Medicine

## 2015-08-26 ENCOUNTER — Ambulatory Visit: Payer: Commercial Managed Care - PPO | Admitting: Family Medicine

## 2015-09-05 ENCOUNTER — Ambulatory Visit (INDEPENDENT_AMBULATORY_CARE_PROVIDER_SITE_OTHER): Payer: Commercial Managed Care - PPO | Admitting: Family Medicine

## 2015-09-05 ENCOUNTER — Encounter: Payer: Self-pay | Admitting: Family Medicine

## 2015-09-05 VITALS — BP 122/80 | HR 87 | Temp 98.1°F | Resp 16 | Ht 67.0 in | Wt 233.4 lb

## 2015-09-05 DIAGNOSIS — F4323 Adjustment disorder with mixed anxiety and depressed mood: Secondary | ICD-10-CM | POA: Insufficient documentation

## 2015-09-05 DIAGNOSIS — Z8052 Family history of malignant neoplasm of bladder: Secondary | ICD-10-CM | POA: Diagnosis not present

## 2015-09-05 DIAGNOSIS — Z800 Family history of malignant neoplasm of digestive organs: Secondary | ICD-10-CM | POA: Diagnosis not present

## 2015-09-05 DIAGNOSIS — Z803 Family history of malignant neoplasm of breast: Secondary | ICD-10-CM | POA: Diagnosis not present

## 2015-09-05 MED ORDER — FLUOXETINE HCL 10 MG PO CAPS: 10.0000 mg | ORAL_CAPSULE | Freq: Every day | ORAL | Status: DC

## 2015-09-05 NOTE — Assessment & Plan Note (Signed)
New.  Pt has every reason to be overwhelmed given the loss of her grandmother and father to cancer and her mom's recent dx of pancreatic cancer.  She is still working full time, has 2 small children and will be mom's primary care giver.  She is having difficulty w/ binge eating, loss of focus, low motivation, and frequent tearfulness.  Start low dose SSRI and anticipate needing to titrate this.  Reviewed possible side effects and proper use of medication.  Will follow closely.

## 2015-09-05 NOTE — Progress Notes (Signed)
Pre visit review using our clinic review tool, if applicable. No additional management support is needed unless otherwise documented below in the visit note. 

## 2015-09-05 NOTE — Progress Notes (Signed)
   Subjective:    Patient ID: [name] Johnston, female    DOB: 02/02/1986, 29 y.o.   MRN: [id number]  HPI Anxiety/depression- PGM died earlier this year w/ Breast Cancer, dad died 4-5 months ago w/ bladder cancer.  Mom just got dx'd w/ pancreatic cancer.  MGF w/ prostate and lung cancer.  Pt is not sure how old grandmother was at time of dx of breast cancer.  Dealing w/ grief of losing father and grandmother, feeling overwhelmed at mom's dx.  Trying to be strong for mom but crying frequently.  'really hard to focus'- has full time job and 2 small kids.  Pt has recently gained 20 lbs after exceptional weight loss.  Pt is binge eating sweets for comfort.  Pt is getting stress hives.     Review of Systems For ROS see HPI     Objective:   Physical Exam  Constitutional: She is oriented to person, place, and time. She appears well-developed and well-nourished. No distress.  HENT:  Head: Normocephalic and atraumatic.  Eyes: Conjunctivae and EOM are normal. Pupils are equal, round, and reactive to light.  Neurological: She is alert and oriented to person, place, and time.  Skin: Skin is warm and dry.  Psychiatric: She has a normal mood and affect. Her behavior is normal. Thought content normal.  Vitals reviewed.         Assessment & Plan:

## 2015-09-05 NOTE — Patient Instructions (Signed)
Follow up in 3-4 weeks to recheck mood Start the Prozac 10 mg daily- this is 1/2 the starting dose We'll call you with your genetics appt Call with any questions or concerns Hang in there!!! We're here if you need us!!!

## 2015-09-05 NOTE — Assessment & Plan Note (Signed)
New to provider.  Pt's father died 4-5 months ago from metastatic bladder cancer.

## 2015-09-05 NOTE — Assessment & Plan Note (Signed)
New.  Pt's PGM died earlier this year of breast cancer.  Age at dx unknown.

## 2015-09-05 NOTE — Assessment & Plan Note (Signed)
New dx for pt's mother.  She is clearly worried about her mother but also what this means for her and her children.  Will refer to oncology for genetic work up to determine if testing is needed.  Pt expressed understanding and is in agreement w/ plan.

## 2015-09-09 ENCOUNTER — Telehealth: Payer: Self-pay | Admitting: Genetic Counselor

## 2015-09-09 NOTE — Telephone Encounter (Signed)
Spoke with pt regarding genetics referral and pt confirmed appt.

## 2015-09-12 ENCOUNTER — Encounter: Payer: Self-pay | Admitting: Family Medicine

## 2015-09-29 ENCOUNTER — Telehealth: Payer: Self-pay | Admitting: Genetic Counselor

## 2015-09-29 NOTE — Telephone Encounter (Signed)
[name] Johnston called to reschedule her genetic counseling appt from 11/29 to Thursday, January 5th, after the holidays are all over.  She has had to miss a lot of work recently, so this day works better for her.

## 2015-09-30 ENCOUNTER — Encounter: Payer: Commercial Managed Care - PPO | Admitting: Genetic Counselor

## 2015-09-30 ENCOUNTER — Other Ambulatory Visit: Payer: Commercial Managed Care - PPO

## 2015-10-02 ENCOUNTER — Encounter: Payer: Self-pay | Admitting: Family Medicine

## 2015-10-02 ENCOUNTER — Other Ambulatory Visit: Payer: Self-pay | Admitting: General Practice

## 2015-10-02 MED ORDER — FLUOXETINE HCL 20 MG PO TABS: 20.0000 mg | ORAL_TABLET | Freq: Every day | ORAL | Status: DC

## 2015-10-03 ENCOUNTER — Ambulatory Visit: Payer: Commercial Managed Care - PPO | Admitting: Family Medicine

## 2015-11-06 ENCOUNTER — Other Ambulatory Visit: Payer: Commercial Managed Care - PPO

## 2015-11-06 ENCOUNTER — Encounter: Payer: Commercial Managed Care - PPO | Admitting: Genetic Counselor

## 2015-11-24 ENCOUNTER — Encounter: Payer: Commercial Managed Care - PPO | Admitting: Family Medicine

## 2016-01-01 ENCOUNTER — Telehealth: Payer: Self-pay | Admitting: *Deleted

## 2016-01-01 NOTE — Telephone Encounter (Signed)
Unable to reach patient at time of pre-visit call. Left message for patient to return call when available.  

## 2016-01-02 ENCOUNTER — Encounter: Payer: Commercial Managed Care - PPO | Admitting: Family Medicine

## 2016-01-05 ENCOUNTER — Encounter: Payer: Self-pay | Admitting: Family Medicine

## 2016-01-05 ENCOUNTER — Telehealth: Payer: Self-pay | Admitting: Family Medicine

## 2016-01-05 NOTE — Telephone Encounter (Signed)
To my knowledge, pt still has not provided a reason why she missed her appt and it was removed from my schedule.  I believe her appt was confirmed w/ pt.

## 2016-01-05 NOTE — Telephone Encounter (Signed)
Pt was no show 01/02/16 3:30pm for cpe appt, pt has not rescheduled, 1st no show I see w/in a year, charge or no charge?

## 2016-01-05 NOTE — Telephone Encounter (Signed)
Yes- pt needs a charge as this was a CPE

## 2016-01-05 NOTE — Telephone Encounter (Signed)
Printed updated letter to waive fee this one time as a courtesy.

## 2016-01-05 NOTE — Telephone Encounter (Signed)
Patient missed CPE appointment on Friday 3/3 and would like to have the fee waived. Charge or No Charge.

## 2016-01-05 NOTE — Telephone Encounter (Signed)
[name] Johnston said she talked to pt and pt stated she was sick and when you're sick you don't think about cancelling appts.

## 2016-01-05 NOTE — Telephone Encounter (Signed)
Marked to charge and mailing no show letter °

## 2016-01-05 NOTE — Telephone Encounter (Signed)
Will waive fee this once but must call in advance to cancel all future appts to avoid the fee

## 2016-01-05 NOTE — Telephone Encounter (Signed)
The appt was conf with pt 01/01/16. I will add it back to the schedule for 3/3 as no show. Angelique Blonderenise, did pt give a reason for missing appt?

## 2016-03-31 ENCOUNTER — Telehealth: Payer: Self-pay | Admitting: Family Medicine

## 2016-03-31 ENCOUNTER — Other Ambulatory Visit: Payer: Self-pay | Admitting: General Practice

## 2016-03-31 ENCOUNTER — Ambulatory Visit (INDEPENDENT_AMBULATORY_CARE_PROVIDER_SITE_OTHER): Payer: Commercial Managed Care - PPO | Admitting: Family Medicine

## 2016-03-31 ENCOUNTER — Encounter: Payer: Self-pay | Admitting: Family Medicine

## 2016-03-31 VITALS — BP 130/90 | HR 84 | Temp 98.4°F | Resp 20 | Ht 67.0 in | Wt 250.4 lb

## 2016-03-31 DIAGNOSIS — J45991 Cough variant asthma: Secondary | ICD-10-CM

## 2016-03-31 MED ORDER — ALBUTEROL SULFATE HFA 108 (90 BASE) MCG/ACT IN AERS: 2.0000 | INHALATION_SPRAY | Freq: Four times a day (QID) | RESPIRATORY_TRACT | Status: DC | PRN

## 2016-03-31 MED ORDER — ALBUTEROL SULFATE HFA 108 (90 BASE) MCG/ACT IN AERS: 2.0000 | INHALATION_SPRAY | RESPIRATORY_TRACT | Status: DC | PRN

## 2016-03-31 MED ORDER — ALBUTEROL SULFATE (2.5 MG/3ML) 0.083% IN NEBU
2.5000 mg | INHALATION_SOLUTION | Freq: Once | RESPIRATORY_TRACT | Status: AC
  Administered 2016-03-31: 2.5 mg via RESPIRATORY_TRACT

## 2016-03-31 NOTE — Progress Notes (Signed)
   Subjective:    Patient ID: [name] Johnston, female    DOB: 1986/02/22, 30 y.o.   MRN: [id number]  HPI Cough- sxs started ~1 month ago.  Cough is not productive.  No fevers.  Denies nasal congestion/drainage.  + SOB but no wheezing.  No family hx of asthma.  Pt was previously on Claritin/Zyrtec but this did not help w/ cough.  Pt reports feeling well w/ exception of ongoing cough.  Pt feels this is allergy triggered but the allergy meds are not helping.   Review of Systems For ROS see HPI     Objective:   Physical Exam  Constitutional: She is oriented to person, place, and time. She appears well-developed and well-nourished. No distress.  HENT:  Head: Normocephalic and atraumatic.  Right Ear: Tympanic membrane normal.  Left Ear: Tympanic membrane normal.  Nose: Mucosal edema and rhinorrhea present. Right sinus exhibits no maxillary sinus tenderness and no frontal sinus tenderness. Left sinus exhibits no maxillary sinus tenderness and no frontal sinus tenderness.  Mouth/Throat: Mucous membranes are normal. Posterior oropharyngeal erythema (w/ PND) present.  Eyes: Conjunctivae and EOM are normal. Pupils are equal, round, and reactive to light.  Neck: Normal range of motion. Neck supple.  Cardiovascular: Normal rate, regular rhythm and normal heart sounds.   Pulmonary/Chest: Effort normal and breath sounds normal. No respiratory distress. She has no wheezes. She has no rales.  Hacking cough- improved s/p neb tx  Lymphadenopathy:    She has no cervical adenopathy.  Neurological: She is alert and oriented to person, place, and time.  Skin: Skin is warm and dry.  Vitals reviewed.         Assessment & Plan:

## 2016-03-31 NOTE — Telephone Encounter (Signed)
Pt states that she needs a prior auth before [facility] will fill

## 2016-03-31 NOTE — Assessment & Plan Note (Signed)
Pt's PE WNL w/ exception of hacking cough and copious PND.  Cough improved s/p neb tx in office.  Restart Zyrtec and Flonase.  Albuterol inhaler prn.  Reviewed supportive care and red flags that should prompt return.  Pt expressed understanding and is in agreement w/ plan.

## 2016-03-31 NOTE — Telephone Encounter (Signed)
Sent in proair per PCP.

## 2016-03-31 NOTE — Progress Notes (Signed)
Pre visit review using our clinic review tool, if applicable. No additional management support is needed unless otherwise documented below in the visit note. 

## 2016-03-31 NOTE — Patient Instructions (Signed)
Follow up as needed Restart the Zyrtec daily Add Flonase to decrease nasal congestion Use the Albuterol inhaler for cough or shortness of breath- 2 puffs as needed every 4 hrs Call with any questions or concerns Hang in there!!!

## 2016-03-31 NOTE — Addendum Note (Signed)
Addended by: Geannie RisenBRODMERKEL, JESSICA L on: 03/31/2016 02:57 PM   Modules accepted: Orders

## 2016-04-16 ENCOUNTER — Telehealth: Payer: Self-pay | Admitting: Family Medicine

## 2016-04-16 NOTE — Telephone Encounter (Signed)
Ok to switch 

## 2016-04-16 NOTE — Telephone Encounter (Signed)
Patient would [facility]  Patient said she wants to switch due to the distance to drive to Dr[facility]abor[place].  [place]?

## 2016-04-19 NOTE — Telephone Encounter (Signed)
Fine with me

## 2016-04-21 ENCOUNTER — Encounter
Admission: RE | Admit: 2016-04-21 | Discharge: 2016-04-21 | Disposition: A | Discharge status: Home / Self Care | Payer: Commercial Managed Care - PPO | Source: Ambulatory Visit | Attending: Obstetrics and Gynecology | Admitting: Obstetrics and Gynecology

## 2016-04-21 DIAGNOSIS — Z6839 Body mass index (BMI) 39.0-39.9, adult: Secondary | ICD-10-CM | POA: Diagnosis not present

## 2016-04-21 DIAGNOSIS — Z01812 Encounter for preprocedural laboratory examination: Secondary | ICD-10-CM | POA: Diagnosis present

## 2016-04-21 DIAGNOSIS — E669 Obesity, unspecified: Secondary | ICD-10-CM | POA: Diagnosis not present

## 2016-04-21 DIAGNOSIS — J45909 Unspecified asthma, uncomplicated: Secondary | ICD-10-CM | POA: Diagnosis not present

## 2016-04-21 HISTORY — DX: Rash and other nonspecific skin eruption: R21

## 2016-04-21 HISTORY — DX: Other complications of anesthesia, initial encounter: T88.59XA

## 2016-04-21 HISTORY — DX: Unspecified asthma, uncomplicated: J45.909

## 2016-04-21 HISTORY — DX: Adverse effect of unspecified anesthetic, initial encounter: T41.45XA

## 2016-04-21 LAB — CBC
HCT: 40.2 % (ref 35.0–47.0)
HEMOGLOBIN: 13.9 g/dL (ref 12.0–16.0)
MCH: 29.1 pg (ref 26.0–34.0)
MCHC: 34.5 g/dL (ref 32.0–36.0)
MCV: 84.3 fL (ref 80.0–100.0)
Platelets: 369 10*3/uL (ref 150–440)
RBC: 4.77 MIL/uL (ref 3.80–5.20)
RDW: 12.9 % (ref 11.5–14.5)
WBC: 7.7 10*3/uL (ref 3.6–11.0)

## 2016-04-21 LAB — BASIC METABOLIC PANEL
Anion gap: 8 (ref 5–15)
BUN: 12 mg/dL (ref 6–20)
CALCIUM: 9.4 mg/dL (ref 8.9–10.3)
CO2: 22 mmol/L (ref 22–32)
CREATININE: 0.71 mg/dL (ref 0.44–1.00)
Chloride: 107 mmol/L (ref 101–111)
GFR calc Af Amer: >60 mL/min (ref >60)
GFR calc non Af Amer: >60 mL/min (ref >60)
GLUCOSE: 93 mg/dL (ref 65–99)
Potassium: 3.9 mmol/L (ref 3.5–5.1)
Sodium: 137 mmol/L (ref 135–145)

## 2016-04-21 LAB — TYPE AND SCREEN
ABO/RH(D): B POS
Antibody Screen: NEGATIVE

## 2016-04-21 NOTE — Patient Instructions (Signed)
  Your procedure is scheduled on: 04/30/16 Fri Report to Same Day Surgery 2nd floor medical mall To find out your arrival time please call [phone] between 1PM - 3PM on 04/29/16 Thurs  Remember: Instructions that are not followed completely may result in serious medical risk, up to and including death, or upon the discretion of your surgeon and anesthesiologist your surgery may need to be rescheduled.    _x___ 1. Do not eat food or drink liquids after midnight. No gum chewing or hard candies.     __x__ 2. No Alcohol for 24 hours before or after surgery.   __x__3. No Smoking for 24 prior to surgery.   ____  4. Bring all medications with you on the day of surgery if instructed.    __x__ 5. Notify your doctor if there is any change in your medical condition     (cold, fever, infections).     Do not wear jewelry, make-up, hairpins, clips or nail polish.  Do not wear lotions, powders, or perfumes. You may wear deodorant.  Do not shave 48 hours prior to surgery. Men may shave face and neck.  Do not bring valuables to the hospital.    [facility] is not responsible for any belongings or valuables.               Contacts, dentures or bridgework may not be worn into surgery.  Leave your suitcase in the car. After surgery it may be brought to your room.  For patients admitted to the hospital, discharge time is determined by your treatment team.   Patients discharged the day of surgery will not be allowed to drive home.    Please read over the following fact sheets that you were given:   [facility] Preparing for Surgery and or MRSA Information   _x___ Take these medicines the morning of surgery with A SIP OF WATER:    1. None  2.  3.  4.  5.  6.  ____ Fleet Enema (as directed)   _x___ Use CHG Soap or sage wipes as directed on instruction sheet   _x___ Inhaler, bring to hospital day of surgery  ____ Stop metformin 2 days prior to surgery    ____ Take 1/2 of usual insulin  dose the night before surgery and none on the morning of           surgery.   ____ Stop aspirin or coumadin, or plavix  _x__ Stop Anti-inflammatories such as Advil, Aleve, Ibuprofen, Motrin, Naproxen,          Naprosyn, Goodies powders or aspirin products. Ok to take Tylenol.   ____ Stop supplements until after surgery.    ____ Bring C-Pap to the hospital.

## 2016-04-21 NOTE — H&P (Signed)
Ms. Doerner is a 30 y.o. female here Elective permanent sterilization  .pt has had 2 implanon /nexplanons placed . The last was 06/2013.   Past Medical History:  has no past medical history on file. Seasonal allergies Past Surgical History:  has a past surgical history that includes Cesarean section (2009 and 2011) and Cholecystectomy. Family History: family history includes Breast cancer in her paternal grandmother; Pancreatic cancer in her mother; Prostate cancer in her maternal grandfather. Social History:  reports that she has never smoked. She does not have any smokeless tobacco history on file. She reports that she does not drink alcohol. OB/GYN History:  OB History    Gravida Para Term Preterm AB TAB SAB Ectopic Multiple Living   Allergies: is allergic to adhesive and other. Medications:  Current Outpatient Prescriptions:  .  albuterol 90 mcg/actuation inhaler, Inhale into the lungs., Disp: , Rfl:  .  nystatin (MYCOSTATIN) 100,000 unit/gram cream, Apply topically 3 (three) times daily., Disp: 30 g, Rfl: 0  Review of Systems: General:                                          No fatigue or weight loss Eyes:                                                         No vision changes Ears:                                                          No hearing difficulty Respiratory:                No cough or shortness of breath, seasonal allergies  Pulmonary:                                      No asthma or shortness of breath Cardiovascular:                     No chest pain, palpitations, dyspnea on exertion Gastrointestinal:                    No abdominal bloating, chronic diarrhea, constipations, masses, pain or hematochezia Genitourinary:                                 No hematuria, dysuria, abnormal vaginal discharge, pelvic pain, Menometrorrhagia Lymphatic:                                       No swollen lymph nodes Musculoskeletal:                    No muscle weakness Neurologic:  No extremity weakness, syncope, seizure disorder Psychiatric:                                      No history of depression, delusions or suicidal/homicidal ideation    Exam:      Vitals:   04/07/16 1413  BP: 114/85  Pulse: 86    Body mass index is 39.16 kg/(m^2).  WDWN white/ female in NAD   Lungs: CTA  CV : RRR without murmur   Left lower arm : circular rash raised with scaling inside  Neck:  no thyromegaly Abdomen: soft , no mass, normal active bowel sounds,  non-tender, no rebound tenderness   Impression:   Elective sterilization . L/S  + falope rings    Plan:   .  counseled regarding elective sterilization . Failure rate 1/200/yr .  Risks discussed with pt        Vilma PraderHOMAS JANSE Athol Bolds, MD       Electronically signed by Sabas Soushomas Janse Shavanna Furnari

## 2016-04-30 ENCOUNTER — Ambulatory Visit
Admission: RE | Admit: 2016-04-30 | Discharge: 2016-04-30 | Disposition: A | Discharge status: Home / Self Care | Payer: Commercial Managed Care - PPO | Source: Ambulatory Visit | Attending: Obstetrics and Gynecology | Admitting: Obstetrics and Gynecology

## 2016-04-30 ENCOUNTER — Encounter: Payer: Self-pay | Admitting: *Deleted

## 2016-04-30 ENCOUNTER — Ambulatory Visit: Payer: Commercial Managed Care - PPO | Admitting: Certified Registered Nurse Anesthetist

## 2016-04-30 ENCOUNTER — Encounter
Admission: RE | Disposition: A | Discharge status: Home / Self Care | Payer: Self-pay | Source: Ambulatory Visit | Attending: Obstetrics and Gynecology

## 2016-04-30 ENCOUNTER — Encounter: Payer: Commercial Managed Care - PPO | Admitting: Family Medicine

## 2016-04-30 DIAGNOSIS — Z9049 Acquired absence of other specified parts of digestive tract: Secondary | ICD-10-CM | POA: Diagnosis not present

## 2016-04-30 DIAGNOSIS — J45909 Unspecified asthma, uncomplicated: Secondary | ICD-10-CM | POA: Insufficient documentation

## 2016-04-30 DIAGNOSIS — Z302 Encounter for sterilization: Secondary | ICD-10-CM | POA: Diagnosis not present

## 2016-04-30 DIAGNOSIS — Z91048 Other nonmedicinal substance allergy status: Secondary | ICD-10-CM | POA: Insufficient documentation

## 2016-04-30 DIAGNOSIS — Z800 Family history of malignant neoplasm of digestive organs: Secondary | ICD-10-CM | POA: Insufficient documentation

## 2016-04-30 DIAGNOSIS — Z8042 Family history of malignant neoplasm of prostate: Secondary | ICD-10-CM | POA: Diagnosis not present

## 2016-04-30 DIAGNOSIS — Z79899 Other long term (current) drug therapy: Secondary | ICD-10-CM | POA: Diagnosis not present

## 2016-04-30 DIAGNOSIS — Z803 Family history of malignant neoplasm of breast: Secondary | ICD-10-CM | POA: Diagnosis not present

## 2016-04-30 HISTORY — PX: LAPAROSCOPIC TUBAL LIGATION: SHX1937

## 2016-04-30 LAB — POCT PREGNANCY, URINE: PREG TEST UR: NEGATIVE

## 2016-04-30 SURGERY — LIGATION, FALLOPIAN TUBE, LAPAROSCOPIC
Anesthesia: General | Laterality: Bilateral

## 2016-04-30 MED ORDER — ONDANSETRON HCL 4 MG/2ML IJ SOLN
INTRAMUSCULAR | Status: DC | PRN
  Administered 2016-04-30: 4 mg via INTRAVENOUS

## 2016-04-30 MED ORDER — ONDANSETRON HCL 4 MG/2ML IJ SOLN
INTRAMUSCULAR | Status: AC
  Administered 2016-04-30: 4 mg via INTRAVENOUS
  Filled 2016-04-30: qty 2

## 2016-04-30 MED ORDER — MIDAZOLAM HCL 2 MG/2ML IJ SOLN
INTRAMUSCULAR | Status: DC | PRN
  Administered 2016-04-30: 2 mg via INTRAVENOUS

## 2016-04-30 MED ORDER — FENTANYL CITRATE (PF) 100 MCG/2ML IJ SOLN
25.0000 ug | INTRAMUSCULAR | Status: DC | PRN
  Administered 2016-04-30 (×4): 25 ug via INTRAVENOUS

## 2016-04-30 MED ORDER — KETOROLAC TROMETHAMINE 30 MG/ML IJ SOLN
INTRAMUSCULAR | Status: DC | PRN
  Administered 2016-04-30: 30 mg via INTRAVENOUS

## 2016-04-30 MED ORDER — HYDROCODONE-ACETAMINOPHEN 7.5-325 MG PO TABS
1.0000 | ORAL_TABLET | Freq: Once | ORAL | Status: AC | PRN
  Administered 2016-04-30: 1 via ORAL

## 2016-04-30 MED ORDER — LACTATED RINGERS IV SOLN
INTRAVENOUS | Status: DC
  Administered 2016-04-30: 07:00:00 via INTRAVENOUS

## 2016-04-30 MED ORDER — PROPOFOL 10 MG/ML IV BOLUS
INTRAVENOUS | Status: DC | PRN
  Administered 2016-04-30: 200 mg via INTRAVENOUS

## 2016-04-30 MED ORDER — MORPHINE SULFATE (PF) 2 MG/ML IV SOLN: 1.0000 mg | INTRAVENOUS | Status: DC | PRN

## 2016-04-30 MED ORDER — ACETAMINOPHEN 10 MG/ML IV SOLN
INTRAVENOUS | Status: AC
  Filled 2016-04-30: qty 100

## 2016-04-30 MED ORDER — FAMOTIDINE 20 MG PO TABS
20.0000 mg | ORAL_TABLET | Freq: Once | ORAL | Status: AC
  Administered 2016-04-30: 20 mg via ORAL

## 2016-04-30 MED ORDER — LACTATED RINGERS IV SOLN: INTRAVENOUS | Status: DC

## 2016-04-30 MED ORDER — FENTANYL CITRATE (PF) 100 MCG/2ML IJ SOLN
INTRAMUSCULAR | Status: DC | PRN
  Administered 2016-04-30 (×3): 50 ug via INTRAVENOUS

## 2016-04-30 MED ORDER — SUGAMMADEX SODIUM 200 MG/2ML IV SOLN
INTRAVENOUS | Status: DC | PRN
  Administered 2016-04-30: 225 mg via INTRAVENOUS

## 2016-04-30 MED ORDER — BUPIVACAINE HCL 0.5 % IJ SOLN
INTRAMUSCULAR | Status: DC | PRN
  Administered 2016-04-30: 7 mL

## 2016-04-30 MED ORDER — ACETAMINOPHEN 10 MG/ML IV SOLN
INTRAVENOUS | Status: DC | PRN
  Administered 2016-04-30: 1000 mg via INTRAVENOUS

## 2016-04-30 MED ORDER — FENTANYL CITRATE (PF) 100 MCG/2ML IJ SOLN
INTRAMUSCULAR | Status: AC
  Administered 2016-04-30: 25 ug via INTRAVENOUS
  Filled 2016-04-30: qty 2

## 2016-04-30 MED ORDER — ONDANSETRON HCL 4 MG/2ML IJ SOLN
4.0000 mg | Freq: Once | INTRAMUSCULAR | Status: AC | PRN
  Administered 2016-04-30: 4 mg via INTRAVENOUS

## 2016-04-30 MED ORDER — DEXAMETHASONE SODIUM PHOSPHATE 10 MG/ML IJ SOLN
INTRAMUSCULAR | Status: DC | PRN
  Administered 2016-04-30: 10 mg via INTRAVENOUS

## 2016-04-30 MED ORDER — LIDOCAINE HCL (CARDIAC) 20 MG/ML IV SOLN
INTRAVENOUS | Status: DC | PRN
  Administered 2016-04-30: 50 mg via INTRAVENOUS

## 2016-04-30 MED ORDER — ONDANSETRON HCL 4 MG/2ML IJ SOLN: 4.0000 mg | Freq: Once | INTRAMUSCULAR | Status: DC | PRN

## 2016-04-30 MED ORDER — BUPIVACAINE HCL (PF) 0.5 % IJ SOLN
INTRAMUSCULAR | Status: AC
  Filled 2016-04-30: qty 30

## 2016-04-30 MED ORDER — SUCCINYLCHOLINE CHLORIDE 20 MG/ML IJ SOLN
INTRAMUSCULAR | Status: DC | PRN
  Administered 2016-04-30: 20 mg via INTRAVENOUS
  Administered 2016-04-30: 140 mg via INTRAVENOUS

## 2016-04-30 MED ORDER — ROCURONIUM BROMIDE 100 MG/10ML IV SOLN
INTRAVENOUS | Status: DC | PRN
  Administered 2016-04-30: 20 mg via INTRAVENOUS

## 2016-04-30 SURGICAL SUPPLY — 24 items
BLADE SURG SZ11 CARB STEEL (BLADE) ×2 IMPLANT
CATH ROBINSON RED A/P 16FR (CATHETERS) ×2 IMPLANT
GAUZE SPONGE NON-WVN 2X2 STRL (MISCELLANEOUS) ×2 IMPLANT
GLOVE BIO SURGEON STRL SZ8 (GLOVE) ×12 IMPLANT
GOWN STRL REUS W/ TWL LRG LVL3 (GOWN DISPOSABLE) ×1 IMPLANT
GOWN STRL REUS W/ TWL XL LVL3 (GOWN DISPOSABLE) ×1 IMPLANT
GOWN STRL REUS W/TWL LRG LVL3 (GOWN DISPOSABLE) ×1
GOWN STRL REUS W/TWL XL LVL3 (GOWN DISPOSABLE) ×1
KIT DISPOSABLE FALLOPE RING (Ring) ×4 IMPLANT
KIT RM TURNOVER CYSTO AR (KITS) ×2 IMPLANT
LABEL OR SOLS (LABEL) ×2 IMPLANT
NS IRRIG 500ML POUR BTL (IV SOLUTION) ×2 IMPLANT
PACK GYN LAPAROSCOPIC (MISCELLANEOUS) ×2 IMPLANT
PAD OB MATERNITY 4.3X12.25 (PERSONAL CARE ITEMS) ×2 IMPLANT
PAD PREP 24X41 OB/GYN DISP (PERSONAL CARE ITEMS) ×2 IMPLANT
SPONGE VERSALON 2X2 STRL (MISCELLANEOUS) ×2
STRIP CLOSURE SKIN 1/2X4 (GAUZE/BANDAGES/DRESSINGS) IMPLANT
STRIP CLOSURE SKIN 1/4X4 (GAUZE/BANDAGES/DRESSINGS) IMPLANT
SUT VIC AB 2-0 UR6 27 (SUTURE) ×2 IMPLANT
SUT VIC AB 4-0 SH 27 (SUTURE) ×1
SUT VIC AB 4-0 SH 27XANBCTRL (SUTURE) ×1 IMPLANT
SWABSTK COMLB BENZOIN TINCTURE (MISCELLANEOUS) ×2 IMPLANT
TROCAR ENDO BLADELESS 11MM (ENDOMECHANICALS) ×2 IMPLANT
TUBING INSUFFLATOR HI FLOW (MISCELLANEOUS) ×2 IMPLANT

## 2016-04-30 NOTE — Anesthesia Procedure Notes (Signed)
Procedure Name: Intubation Date/Time: 04/30/2016 7:30 AM Performed by: Ginger CarneMICHELET, Josceline Chenard Pre-anesthesia Checklist: Patient identified, Emergency Drugs available, Suction available, Patient being monitored and Timeout performed Patient Re-evaluated:Patient Re-evaluated prior to inductionOxygen Delivery Method: Circle system utilized Preoxygenation: Pre-oxygenation with 100% oxygen Intubation Type: IV induction Ventilation: Mask ventilation without difficulty Laryngoscope Size: 2 and Miller Grade View: Grade I Tube type: Oral Tube size: 7.5 mm Number of attempts: 1 Airway Equipment and Method: Stylet Placement Confirmation: ETT inserted through vocal cords under direct vision,  positive ETCO2 and breath sounds checked- equal and bilateral Secured at: 22 cm Tube secured with: Tape Dental Injury: Teeth and Oropharynx as per pre-operative assessment

## 2016-04-30 NOTE — Anesthesia Preprocedure Evaluation (Signed)
Anesthesia Evaluation  Patient identified by MRN, date of birth, ID band Patient awake    Reviewed: Allergy & Precautions, NPO status   History of Anesthesia Complications (+) PONV  Airway Mallampati: II       Dental  (+) Teeth Intact   Pulmonary neg pulmonary ROS, asthma ,    breath sounds clear to auscultation       Cardiovascular Exercise Tolerance: Good  Rhythm:Regular     Neuro/Psych negative neurological ROS     GI/Hepatic negative GI ROS, Neg liver ROS,   Endo/Other  negative endocrine ROS  Renal/GU negative Renal ROS     Musculoskeletal negative musculoskeletal ROS (+)   Abdominal Normal abdominal exam  (+)   Peds  Hematology negative hematology ROS (+)   Anesthesia Other Findings   Reproductive/Obstetrics                             Anesthesia Physical Anesthesia Plan  ASA: II  Anesthesia Plan: General   Post-op Pain Management:    Induction: Intravenous  Airway Management Planned: Oral ETT  Additional Equipment:   Intra-op Plan:   Post-operative Plan: Extubation in OR  Informed Consent: I have reviewed the patients History and Physical, chart, labs and discussed the procedure including the risks, benefits and alternatives for the proposed anesthesia with the patient or authorized representative who has indicated his/her understanding and acceptance.     Plan Discussed with: CRNA  Anesthesia Plan Comments:         Anesthesia Quick Evaluation

## 2016-04-30 NOTE — Anesthesia Postprocedure Evaluation (Signed)
Anesthesia Post Note  Patient: [name] Johnston  Procedure(s) Performed: Procedure(s) (LRB): LAPAROSCOPIC TUBAL LIGATION (Bilateral)  Patient location during evaluation: PACU Anesthesia Type: General Level of consciousness: awake Pain management: pain level controlled Vital Signs Assessment: post-procedure vital signs reviewed and stable Respiratory status: nonlabored ventilation Cardiovascular status: blood pressure returned to baseline Anesthetic complications: no    Last Vitals:  Filed Vitals:   04/30/16 0619 04/30/16 0825  BP: 136/84 140/82  Pulse: 69 83  Temp: 36.6 C 36.4 C  Resp: 18 18    Last Pain:  Filed Vitals:   04/30/16 0828  PainSc: Asleep                 VAN STAVEREN,[name] Johnston

## 2016-04-30 NOTE — Op Note (Signed)
NAMJessica Priest:  Benito, Katiana              ACCOUNT NO.:  [id number]  MEDICAL RECORD NO.:  [id number]  LOCATION:  [facility]                         FACILITY:  [facility]  PHYSICIAN:  Jennell Cornerhomas Schermerhorn, MDDATE OF BIRTH:  [birth year]  DATE OF PROCEDURE:  04/30/2016 DATE OF DISCHARGE:                              OPERATIVE REPORT   PREOPERATIVE DIAGNOSIS:  Elective permanent sterilization.  POSTOPERATIVE DIAGNOSIS:  Elective permanent sterilization.  PROCEDURE PERFORMED:  Laparoscopic bilateral tubal ligation, Falope rings.  SURGEON:  Jennell Cornerhomas Schermerhorn, MD  ANESTHESIA:  General endotracheal anesthesia.  SURGEON:  Jennell Cornerhomas Schermerhorn, MD  INDICATIONS:  A 30 year old, gravida 2, para 2 patient is decided on elective permanent sterilization.  The patient is aware of the risk of the procedure and the failure rate of 1/200 per year.  DESCRIPTION OF PROCEDURE:  After adequate general endotracheal anesthesia, patient was placed in dorsal supine position.  Legs in the [place] stirrups.  The patient's abdomen, perineum, and vagina were prepped and draped in normal sterile fashion.  Time-out was performed. Bladder was catheterized with a red Robin catheter and 200 mL clear urine were removed.  Speculum was placed in the vagina and the anterior cervix was grasped with a single-tooth tenaculum and KAHN cannula was placed into the endocervical canal to be used for uterine manipulation during the procedure.  Gloves were changed.  Attention was directed to the patient's abdomen.  A 12 mm infraumbilical incision was made after injected with 0.5% Marcaine.  The laparoscope was advanced into the abdominal cavity under direct visualization with the Optiview cannula. The patient's abdomen was insufflated and patient was placed in Trendelenburg, 2nd incision was made 2 fingerbreadths above the symphysis pubis.  The Falope ring trocar was advanced under direct visualization.  Attention was directed to the patient's  right fallopian tube.  The fimbriated end was visualized and at the isthmic ampullary portion of the fallopian tube, the Falope ring was applied with resulting 1.5 cm knuckle of tissue.  Picture was taken.  Similar procedure was repeated on the patient's left fallopian tube after visualizing the fimbriated end.  Falope ring was applied.  Good application picture was taken.  Posterior cul-de-sac appeared normal. Ovaries appeared normal.  Upper abdomen appeared normal.  The patient's abdomen was deflated and the infraumbilical incision was closed with a deep fascial layer of 2-0 Vicryl suture and both skin incisions were closed with interrupted 4-0 Vicryl suture.  Tegaderm was applied.  The KAHN cannula and single-tooth tenaculum were removed.  No bleeding was noted.  There were no complications.  ESTIMATED BLOOD LOSS:  Minimal.  INTRAOPERATIVE FLUIDS:  700 mL.  URINE OUTPUT:  200 mL.  Patient tolerated the procedure well and was taken to recovery room in good condition.          ______________________________ Jennell Cornerhomas Schermerhorn, MD     TS/MEDQ  D:  04/30/2016  T:  04/30/2016  Job:  [id number]

## 2016-04-30 NOTE — Discharge Instructions (Signed)
  AMBULATORY SURGERY  DISCHARGE INSTRUCTIONS   1) The drugs that you were given will stay in your system until tomorrow so for the next 24 hours you should not:  A) Drive an automobile B) Make any legal decisions C) Drink any alcoholic beverage   2) You may resume regular meals tomorrow.  Today it is better to start with liquids and gradually work up to solid foods.  You may eat anything you prefer, but it is better to start with liquids, then soup and crackers, and gradually work up to solid foods.   3) Please notify your doctor immediately if you have any unusual bleeding, trouble breathing, redness and pain at the surgery site, drainage, fever, or pain not relieved by medication.    4) Additional Instructions: TAKE A STOOL SOFTENER TWICE A DAY WHILE TAKING NARCOTIC PAIN MEDICINE TO PREVENT CONSTIPATION   Please contact your physician with any problems or Same Day Surgery at [phone], Monday through Friday 6 am to 4 pm, or [place] [facility] Main number at [phone].   

## 2016-04-30 NOTE — Transfer of Care (Signed)
Immediate Anesthesia Transfer of Care Note  Patient: [name] DaubBetsy R Goodpasture  Procedure(s) Performed: Procedure(s): LAPAROSCOPIC TUBAL LIGATION (Bilateral)  Patient Location: PACU  Anesthesia Type:General  Level of Consciousness: awake and alert   Airway & Oxygen Therapy: Patient Spontanous Breathing and Patient connected to face mask oxygen  Post-op Assessment: Report given to RN and Post -op Vital signs reviewed and stable  Post vital signs: Reviewed and stable  Last Vitals:  Filed Vitals:   04/30/16 0619  BP: 136/84  Pulse: 69  Temp: 36.6 C  Resp: 18    Last Pain: There were no vitals filed for this visit.       Complications: No apparent anesthesia complications

## 2016-04-30 NOTE — Brief Op Note (Signed)
04/30/2016  8:16 AM  PATIENT:  Baldo DaubBetsy R Tebbetts  30 y.o. female  PRE-OPERATIVE DIAGNOSIS:  Sterilization  POST-OPERATIVE DIAGNOSIS:  Sterilization  PROCEDURE:  Procedure(s): LAPAROSCOPIC TUBAL LIGATION (Bilateral)  SURGEON:  Surgeon(s) and Role:    Suzy Bouchard* Thomas J Schermerhorn, MD - Primary  PHYSICIAN ASSISTANT:   ASSISTANTS: none   ANESTHESIA:  GETA EBL:  Total I/O In: 700 [I.V.:700] Out: 200 [Urine:200]  BLOOD ADMINISTERED:none  DRAINS: none   LOCAL MEDICATIONS USED: 0.5% marcaine  SPECIMEN:  No Specimen  DISPOSITION OF SPECIMEN:  N/A  COUNTS:  YES  TOURNIQUET:  * No tourniquets in log *  DICTATION: .Other Dictation: Dictation Number verbal  PLAN OF CARE: Discharge to home after PACU  PATIENT DISPOSITION:  PACU - hemodynamically stable.   Delay start of Pharmacological VTE agent (>24hrs) due to surgical blood loss or risk of bleeding: not applicable

## 2016-04-30 NOTE — Progress Notes (Signed)
Pt is ready for L/S BTL . NPO and labs normal . Neg HCG

## 2016-07-28 ENCOUNTER — Encounter: Payer: Self-pay | Admitting: Internal Medicine

## 2016-07-28 ENCOUNTER — Ambulatory Visit (INDEPENDENT_AMBULATORY_CARE_PROVIDER_SITE_OTHER): Payer: Commercial Managed Care - PPO | Admitting: Internal Medicine

## 2016-07-28 VITALS — BP 124/84 | HR 77 | Temp 98.5°F | Ht 66.0 in | Wt 245.0 lb

## 2016-07-28 DIAGNOSIS — E010 Iodine-deficiency related diffuse (endemic) goiter: Secondary | ICD-10-CM

## 2016-07-28 DIAGNOSIS — Z0000 Encounter for general adult medical examination without abnormal findings: Secondary | ICD-10-CM | POA: Diagnosis not present

## 2016-07-28 DIAGNOSIS — E049 Nontoxic goiter, unspecified: Secondary | ICD-10-CM

## 2016-07-28 LAB — COMPREHENSIVE METABOLIC PANEL
ALT: 65 U/L — ABNORMAL HIGH (ref 0–35)
AST: 32 U/L (ref 0–37)
Albumin: 4.2 g/dL (ref 3.5–5.2)
Alkaline Phosphatase: 96 U/L (ref 39–117)
BUN: 8 mg/dL (ref 6–23)
CO2: 27 mEq/L (ref 19–32)
Calcium: 9.6 mg/dL (ref 8.4–10.5)
Chloride: 104 mEq/L (ref 96–112)
Creatinine, Ser: 0.74 mg/dL (ref 0.40–1.20)
GFR: 97.64 mL/min (ref >60.00)
Glucose, Bld: 97 mg/dL (ref 70–99)
Potassium: 3.9 mEq/L (ref 3.5–5.1)
Sodium: 140 mEq/L (ref 135–145)
Total Bilirubin: 0.6 mg/dL (ref 0.2–1.2)
Total Protein: 7.4 g/dL (ref 6.0–8.3)

## 2016-07-28 LAB — CBC
HEMATOCRIT: 42.1 % (ref 36.0–46.0)
Hemoglobin: 14.3 g/dL (ref 12.0–15.0)
MCHC: 33.9 g/dL (ref 30.0–36.0)
MCV: 85.5 fl (ref 78.0–100.0)
PLATELETS: 398 10*3/uL (ref 150.0–400.0)
RBC: 4.92 Mil/uL (ref 3.87–5.11)
RDW: 12.7 % (ref 11.5–15.5)
WBC: 11 10*3/uL — ABNORMAL HIGH (ref 4.0–10.5)

## 2016-07-28 LAB — LIPID PANEL
CHOLESTEROL: 173 mg/dL (ref 0–200)
HDL: 53.3 mg/dL (ref >39.00)
LDL Cholesterol: 80 mg/dL (ref 0–99)
NonHDL: 119.5
TRIGLYCERIDES: 199 mg/dL — AB (ref 0.0–149.0)
Total CHOL/HDL Ratio: 3
VLDL: 39.8 mg/dL (ref 0.0–40.0)

## 2016-07-28 LAB — T4, FREE: FREE T4: 0.79 ng/dL (ref 0.60–1.60)

## 2016-07-28 LAB — HEMOGLOBIN A1C: Hgb A1c MFr Bld: 5.3 % (ref 4.6–6.5)

## 2016-07-28 LAB — TSH: TSH: 0.9 u[IU]/mL (ref 0.35–4.50)

## 2016-07-28 NOTE — Patient Instructions (Signed)
Health Maintenance, Female Adopting a healthy lifestyle and getting preventive care can go a long way to promote health and wellness. Talk with your health care provider about what schedule of regular examinations is right for you. This is a good chance for you to check in with your provider about disease prevention and staying healthy. In between checkups, there are plenty of things you can do on your own. Experts have done a lot of research about which lifestyle changes and preventive measures are most likely to keep you healthy. Ask your health care provider for more information. WEIGHT AND DIET  Eat a healthy diet  Be sure to include plenty of vegetables, fruits, low-fat dairy products, and lean protein.  Do not eat a lot of foods high in solid fats, added sugars, or salt.  Get regular exercise. This is one of the most important things you can do for your health.  Most adults should exercise for at least 150 minutes each week. The exercise should increase your heart rate and make you sweat (moderate-intensity exercise).  Most adults should also do strengthening exercises at least twice a week. This is in addition to the moderate-intensity exercise.  Maintain a healthy weight  Body mass index (BMI) is a measurement that can be used to identify possible weight problems. It estimates body fat based on height and weight. Your health care provider can help determine your BMI and help you achieve or maintain a healthy weight.  For females 20 years of age and older:   A BMI below 18.5 is considered underweight.  A BMI of 18.5 to 24.9 is normal.  A BMI of 25 to 29.9 is considered overweight.  A BMI of 30 and above is considered obese.  Watch levels of cholesterol and blood lipids  You should start having your blood tested for lipids and cholesterol at 30 years of age, then have this test every 5 years.  You may need to have your cholesterol levels checked more often if:  Your lipid  or cholesterol levels are high.  You are older than 30 years of age.  You are at high risk for heart disease.  CANCER SCREENING   Lung Cancer  Lung cancer screening is recommended for adults 55-80 years old who are at high risk for lung cancer because of a history of smoking.  A yearly low-dose CT scan of the lungs is recommended for people who:  Currently smoke.  Have quit within the past 15 years.  Have at least a 30-pack-year history of smoking. A pack year is smoking an average of one pack of cigarettes a day for 1 year.  Yearly screening should continue until it has been 15 years since you quit.  Yearly screening should stop if you develop a health problem that would prevent you from having lung cancer treatment.  Breast Cancer  Practice breast self-awareness. This means understanding how your breasts normally appear and feel.  It also means doing regular breast self-exams. Let your health care provider know about any changes, no matter how small.  If you are in your 20s or 30s, you should have a clinical breast exam (CBE) by a health care provider every 1-3 years as part of a regular health exam.  If you are 40 or older, have a CBE every year. Also consider having a breast X-ray (mammogram) every year.  If you have a family history of breast cancer, talk to your health care provider about genetic screening.  If you   are at high risk for breast cancer, talk to your health care provider about having an MRI and a mammogram every year.  Breast cancer gene (BRCA) assessment is recommended for women who have family members with BRCA-related cancers. BRCA-related cancers include:  Breast.  Ovarian.  Tubal.  Peritoneal cancers.  Results of the assessment will determine the need for genetic counseling and BRCA1 and BRCA2 testing. Cervical Cancer Your health care provider may recommend that you be screened regularly for cancer of the pelvic organs (ovaries, uterus, and  vagina). This screening involves a pelvic examination, including checking for microscopic changes to the surface of your cervix (Pap test). You may be encouraged to have this screening done every 3 years, beginning at age 21.  For women ages 30-65, health care providers may recommend pelvic exams and Pap testing every 3 years, or they may recommend the Pap and pelvic exam, combined with testing for human papilloma virus (HPV), every 5 years. Some types of HPV increase your risk of cervical cancer. Testing for HPV may also be done on women of any age with unclear Pap test results.  Other health care providers may not recommend any screening for nonpregnant women who are considered low risk for pelvic cancer and who do not have symptoms. Ask your health care provider if a screening pelvic exam is right for you.  If you have had past treatment for cervical cancer or a condition that could lead to cancer, you need Pap tests and screening for cancer for at least 20 years after your treatment. If Pap tests have been discontinued, your risk factors (such as having a new sexual partner) need to be reassessed to determine if screening should resume. Some women have medical problems that increase the chance of getting cervical cancer. In these cases, your health care provider may recommend more frequent screening and Pap tests. Colorectal Cancer  This type of cancer can be detected and often prevented.  Routine colorectal cancer screening usually begins at 30 years of age and continues through 30 years of age.  Your health care provider may recommend screening at an earlier age if you have risk factors for colon cancer.  Your health care provider may also recommend using home test kits to check for hidden blood in the stool.  A small camera at the end of a tube can be used to examine your colon directly (sigmoidoscopy or colonoscopy). This is done to check for the earliest forms of colorectal  cancer.  Routine screening usually begins at age 50.  Direct examination of the colon should be repeated every 5-10 years through 30 years of age. However, you may need to be screened more often if early forms of precancerous polyps or small growths are found. Skin Cancer  Check your skin from head to toe regularly.  Tell your health care provider about any new moles or changes in moles, especially if there is a change in a mole's shape or color.  Also tell your health care provider if you have a mole that is larger than the size of a pencil eraser.  Always use sunscreen. Apply sunscreen liberally and repeatedly throughout the day.  Protect yourself by wearing long sleeves, pants, a wide-brimmed hat, and sunglasses whenever you are outside. HEART DISEASE, DIABETES, AND HIGH BLOOD PRESSURE   High blood pressure causes heart disease and increases the risk of stroke. High blood pressure is more likely to develop in:  People who have blood pressure in the high end   of the normal range (130-139/85-89 mm Hg).  People who are overweight or obese.  People who are African American.  If you are 38-23 years of age, have your blood pressure checked every 3-5 years. If you are 61 years of age or older, have your blood pressure checked every year. You should have your blood pressure measured twice--once when you are at a hospital or clinic, and once when you are not at a hospital or clinic. Record the average of the two measurements. To check your blood pressure when you are not at a hospital or clinic, you can use:  An automated blood pressure machine at a pharmacy.  A home blood pressure monitor.  If you are between 45 years and 39 years old, ask your health care provider if you should take aspirin to prevent strokes.  Have regular diabetes screenings. This involves taking a blood sample to check your fasting blood sugar level.  If you are at a normal weight and have a low risk for diabetes,  have this test once every three years after 30 years of age.  If you are overweight and have a high risk for diabetes, consider being tested at a younger age or more often. PREVENTING INFECTION  Hepatitis B  If you have a higher risk for hepatitis B, you should be screened for this virus. You are considered at high risk for hepatitis B if:  You were born in a country where hepatitis B is common. Ask your health care provider which countries are considered high risk.  Your parents were born in a high-risk country, and you have not been immunized against hepatitis B (hepatitis B vaccine).  You have HIV or AIDS.  You use needles to inject street drugs.  You live with someone who has hepatitis B.  You have had sex with someone who has hepatitis B.  You get hemodialysis treatment.  You take certain medicines for conditions, including cancer, organ transplantation, and autoimmune conditions. Hepatitis C  Blood testing is recommended for:  Everyone born from [birth year] through 1965.  Anyone with known risk factors for hepatitis C. Sexually transmitted infections (STIs)  You should be screened for sexually transmitted infections (STIs) including gonorrhea and chlamydia if:  You are sexually active and are younger than 30 years of age.  You are older than 30 years of age and your health care provider tells you that you are at risk for this type of infection.  Your sexual activity has changed since you were last screened and you are at an increased risk for chlamydia or gonorrhea. Ask your health care provider if you are at risk.  If you do not have HIV, but are at risk, it may be recommended that you take a prescription medicine daily to prevent HIV infection. This is called pre-exposure prophylaxis (PrEP). You are considered at risk if:  You are sexually active and do not regularly use condoms or know the HIV status of your partner(s).  You take drugs by injection.  You are sexually  active with a partner who has HIV. Talk with your health care provider about whether you are at high risk of being infected with HIV. If you choose to begin PrEP, you should first be tested for HIV. You should then be tested every 3 months for as long as you are taking PrEP.  PREGNANCY   If you are premenopausal and you may become pregnant, ask your health care provider about preconception counseling.  If you may  become pregnant, take 400 to 800 micrograms (mcg) of folic acid every day.  If you want to prevent pregnancy, talk to your health care provider about birth control (contraception). OSTEOPOROSIS AND MENOPAUSE   Osteoporosis is a disease in which the bones lose minerals and strength with aging. This can result in serious bone fractures. Your risk for osteoporosis can be identified using a bone density scan.  If you are 61 years of age or older, or if you are at risk for osteoporosis and fractures, ask your health care provider if you should be screened.  Ask your health care provider whether you should take a calcium or vitamin D supplement to lower your risk for osteoporosis.  Menopause may have certain physical symptoms and risks.  Hormone replacement therapy may reduce some of these symptoms and risks. Talk to your health care provider about whether hormone replacement therapy is right for you.  HOME CARE INSTRUCTIONS   Schedule regular health, dental, and eye exams.  Stay current with your immunizations.   Do not use any tobacco products including cigarettes, chewing tobacco, or electronic cigarettes.  If you are pregnant, do not drink alcohol.  If you are breastfeeding, limit how much and how often you drink alcohol.  Limit alcohol intake to no more than 1 drink per day for nonpregnant women. One drink equals 12 ounces of beer, 5 ounces of wine, or 1 ounces of hard liquor.  Do not use street drugs.  Do not share needles.  Ask your health care provider for help if  you need support or information about quitting drugs.  Tell your health care provider if you often feel depressed.  Tell your health care provider if you have ever been abused or do not feel safe at home.   This information is not intended to replace advice given to you by your health care provider. Make sure you discuss any questions you have with your health care provider.   Document Released: 05/03/2011 Document Revised: 11/08/2014 Document Reviewed: 09/19/2013 Elsevier Interactive Patient Education [employer].

## 2016-07-28 NOTE — Progress Notes (Signed)
HPI  Pt presents to the clinic today to establish care and for management of the conditions listed below. She has not had a PCP in many years.  Allergy induced asthma: This affects her seasonally. She has tried antihistamines but reports it has not worked. She uses Albuterol as needed with good relief.  Flu: 08/2015, gets through employer Tetanus: unsure Pap Smear: 11/2014 Dentist: as needed  Diet: She does not eat meat. She eats more fruits than vegetables. She consumes some fried foods. She drinks mostly water and coffee. Exercise: None  Past Medical History:  Diagnosis Date  . Asthma    seasonal allergies  . Complication of anesthesia    nausea  . Obesity (BMI 30-39.9)   . Rash    Lt forearm circular    Current Outpatient Prescriptions  Medication Sig Dispense Refill  . albuterol (PROAIR HFA) 108 (90 Base) MCG/ACT inhaler Inhale 2 puffs into the lungs every 4 (four) hours as needed for wheezing or shortness of breath. (Patient not taking: Reported on 07/28/2016) 1 Inhaler 2   No current facility-administered medications for this visit.     Allergies  Allergen Reactions  . Other Hives    Steri strips  . Tape Rash    Family History  Problem Relation Age of Onset  . Thyroid disease Mother   . Pancreatic cancer Mother   . Bladder Cancer Father   . Arthritis Maternal Grandmother   . Diabetes Maternal Grandmother   . Prostate cancer Maternal Grandfather   . Breast cancer Paternal Grandmother     Social History   Social History  . Marital status: Single    Spouse name: N/A  . Number of children: N/A  . Years of education: N/A   Occupational History  . Not on file.   Social History Main Topics  . Smoking status: Never Smoker  . Smokeless tobacco: Not on file  . Alcohol use Yes     Comment: occasional  . Drug use: No  . Sexual activity: Yes   Other Topics Concern  . Not on file   Social History Narrative  . No narrative on file     ROS:  Constitutional: Denies fever, malaise, fatigue, headache or abrupt weight changes.  HEENT: Denies eye pain, eye redness, ear pain, ringing in the ears, wax buildup, runny nose, nasal congestion, bloody nose, or sore throat. Respiratory: Denies difficulty breathing, shortness of breath, cough or sputum production.   Cardiovascular: Denies chest pain, chest tightness, palpitations or swelling in the hands or feet.  Gastrointestinal: Denies abdominal pain, bloating, constipation, diarrhea or blood in the stool.  GU: Denies frequency, urgency, pain with urination, blood in urine, odor or discharge. Musculoskeletal: Denies decrease in range of motion, difficulty with gait, muscle pain or joint pain and swelling.  Skin: Denies redness, rashes, lesions or ulcercations.  Neurological: Denies dizziness, difficulty with memory, difficulty with speech or problems with balance and coordination.  Psych: Denies anxiety, depression, SI/HI.  No other specific complaints in a complete review of systems (except as listed in HPI above).  PE:  BP 124/84   Pulse 77   Temp 98.5 F (36.9 C) (Oral)   Ht 5\' 6"  (1.676 m)   Wt 245 lb (111.1 kg)   SpO2 98%   BMI 39.54 kg/m  Wt Readings from Last 3 Encounters:  07/28/16 245 lb (111.1 kg)  04/30/16 250 lb (113.4 kg)  04/21/16 250 lb (113.4 kg)    General: Appears her stated age, obese  in NAD. HEENT: Head: normal shape and size; Eyes: sclera white, no icterus, conjunctiva pink, PERRLA and EOMs intact; Ears: Tm's gray and intact, normal light reflex;Throat/Mouth: Teeth present, mucosa pink and moist, no lesions or ulcerations noted.  Neck: Neck supple, trachea midline. No masses, lumps present. Thyromegaly noted.  Cardiovascular: Normal rate and rhythm. S1,S2 noted.  No murmur, rubs or gallops noted. No JVD or BLE edema.  Pulmonary/Chest: Normal effort and positive vesicular breath sounds. No respiratory distress. No wheezes, rales or ronchi noted.   Abdomen: Soft and nontender. Normal bowel sounds. No distention or masses noted. Liver, spleen and kidneys non palpable. Musculoskeletal: Normal range of motion. Strength 5/5 BUE/BLE. No signs of joint swelling. No difficulty with gait.  Neurological: Alert and oriented. Cranial nerves II-XII grossly intact. Coordination normal.  Psychiatric: Mood and affect normal. Behavior is normal. Judgment and thought content normal.     BMET    Component Value Date/Time   NA 137 04/21/2016 1033   NA 140 07/09/2012 0445   K 3.9 04/21/2016 1033   K 3.8 07/09/2012 0445   CL 107 04/21/2016 1033   CL 107 07/09/2012 0445   CO2 22 04/21/2016 1033   CO2 24 07/09/2012 0445   GLUCOSE 93 04/21/2016 1033   GLUCOSE 107 (H) 07/09/2012 0445   BUN 12 04/21/2016 1033   BUN 12 07/09/2012 0445   CREATININE 0.71 04/21/2016 1033   CREATININE 0.72 07/09/2012 0445   CALCIUM 9.4 04/21/2016 1033   CALCIUM 9.1 07/09/2012 0445   GFRNONAA >60 04/21/2016 1033   GFRNONAA >60 07/09/2012 0445   GFRAA >60 04/21/2016 1033   GFRAA >60 07/09/2012 0445    Lipid Panel     Component Value Date/Time   CHOL 182 11/20/2014 1638   TRIG 172.0 (H) 11/20/2014 1638   HDL 46.00 11/20/2014 1638   CHOLHDL 4 11/20/2014 1638   VLDL 34.4 11/20/2014 1638   LDLCALC 102 (H) 11/20/2014 1638    CBC    Component Value Date/Time   WBC 7.7 04/21/2016 1033   RBC 4.77 04/21/2016 1033   HGB 13.9 04/21/2016 1033   HGB 14.4 07/09/2012 0445   HCT 40.2 04/21/2016 1033   HCT 41.8 07/09/2012 0445   PLT 369 04/21/2016 1033   PLT 321 07/09/2012 0445   MCV 84.3 04/21/2016 1033   MCV 85 07/09/2012 0445   MCH 29.1 04/21/2016 1033   MCHC 34.5 04/21/2016 1033   RDW 12.9 04/21/2016 1033   RDW 13.3 07/09/2012 0445   LYMPHSABS 3.0 11/20/2014 1638   MONOABS 0.5 11/20/2014 1638   EOSABS 0.1 11/20/2014 1638   BASOSABS 0.2 (H) 11/20/2014 1638    Hgb A1C No results found for: HGBA1C   Assessment and Plan:  Preventative Health  Maintenance:  She gets flu shot with employer She will check into her last tetanus vaccine Papp Smear UTD Encouraged her to consume a balanced diet and exercise program Advised her to see a dentist at least annually Will check CBC, CMET, Lipid, A1C today  Thyromegaly:  Will check TS Hand T4 today  RTC in 1 year, sooner if needed Nicki ReaperBAITY, REGINA, NP

## 2016-09-14 ENCOUNTER — Encounter: Payer: Self-pay | Admitting: Internal Medicine

## 2016-09-16 ENCOUNTER — Ambulatory Visit (INDEPENDENT_AMBULATORY_CARE_PROVIDER_SITE_OTHER): Payer: Commercial Managed Care - PPO | Admitting: Internal Medicine

## 2016-09-16 ENCOUNTER — Encounter: Payer: Self-pay | Admitting: Internal Medicine

## 2016-09-16 VITALS — BP 126/84 | HR 74 | Temp 98.7°F | Wt 246.0 lb

## 2016-09-16 DIAGNOSIS — F329 Major depressive disorder, single episode, unspecified: Secondary | ICD-10-CM

## 2016-09-16 DIAGNOSIS — F419 Anxiety disorder, unspecified: Principal | ICD-10-CM

## 2016-09-16 DIAGNOSIS — F32A Depression, unspecified: Secondary | ICD-10-CM

## 2016-09-16 DIAGNOSIS — F418 Other specified anxiety disorders: Secondary | ICD-10-CM

## 2016-09-16 MED ORDER — ALPRAZOLAM 0.25 MG PO TABS: 0.2500 mg | ORAL_TABLET | Freq: Two times a day (BID) | ORAL | 0 refills | Status: DC | PRN

## 2016-09-16 MED ORDER — BUSPIRONE HCL 5 MG PO TABS: 5.0000 mg | ORAL_TABLET | Freq: Every day | ORAL | 2 refills | Status: DC

## 2016-09-16 NOTE — Patient Instructions (Signed)
Generalized Anxiety Disorder Generalized anxiety disorder (GAD) is a mental disorder. It interferes with life functions, including relationships, work, and school. GAD is different from normal anxiety, which everyone experiences at some point in their lives in response to specific life events and activities. Normal anxiety actually helps us prepare for and get through these life events and activities. Normal anxiety goes away after the event or activity is over.  GAD causes anxiety that is not necessarily related to specific events or activities. It also causes excess anxiety in proportion to specific events or activities. The anxiety associated with GAD is also difficult to control. GAD can vary from mild to severe. People with severe GAD can have intense waves of anxiety with physical symptoms (panic attacks).  SYMPTOMS The anxiety and worry associated with GAD are difficult to control. This anxiety and worry are related to many life events and activities and also occur more days than not for 6 months or longer. People with GAD also have three or more of the following symptoms (one or more in children):  Restlessness.   Fatigue.  Difficulty concentrating.   Irritability.  Muscle tension.  Difficulty sleeping or unsatisfying sleep. DIAGNOSIS GAD is diagnosed through an assessment by your health care provider. Your health care provider will ask you questions aboutyour mood,physical symptoms, and events in your life. Your health care provider may ask you about your medical history and use of alcohol or drugs, including prescription medicines. Your health care provider may also do a physical exam and blood tests. Certain medical conditions and the use of certain substances can cause symptoms similar to those associated with GAD. Your health care provider may refer you to a mental health specialist for further evaluation. TREATMENT The following therapies are usually used to treat GAD:    Medication. Antidepressant medication usually is prescribed for long-term daily control. Antianxiety medicines may be added in severe cases, especially when panic attacks occur.   Talk therapy (psychotherapy). Certain types of talk therapy can be helpful in treating GAD by providing support, education, and guidance. A form of talk therapy called cognitive behavioral therapy can teach you healthy ways to think about and react to daily life events and activities.  Stress managementtechniques. These include yoga, meditation, and exercise and can be very helpful when they are practiced regularly. A mental health specialist can help determine which treatment is best for you. Some people see improvement with one therapy. However, other people require a combination of therapies. This information is not intended to replace advice given to you by your health care provider. Make sure you discuss any questions you have with your health care provider. Document Released: 02/12/2013 Document Revised: 11/08/2014 Document Reviewed: 02/12/2013 Elsevier Interactive Patient Education  2017 [employer]  

## 2016-09-16 NOTE — Progress Notes (Signed)
Subjective:    Patient ID: [name] Johnston, female    DOB: 1986-04-06, 30 y.o.   MRN: [id number]  HPI  Pt presents to the clinic today to follow up anxiety and depression. She reports her depression is situational. She lost her grandmother and father in 2016. Her mother was diagnosed with inoperable pancreatic cancer last year and is not doing well. She was started on Prozac by Dr. Beverely Lowabori 09/2015, but she reports she did not feel like it worked for her because she could not remember to take it every day. She gets overwhelmed at certain points throughout the day, and this makes her extremely anxious. She would like something to take "in the moment" during these periods of anxiety. She has not seen a counselor or therapist. Shed denies SI/HI.  Review of Systems      Past Medical History:  Diagnosis Date  . Asthma    seasonal allergies  . Complication of anesthesia    nausea  . Obesity (BMI 30-39.9)   . Rash    Lt forearm circular    Current Outpatient Prescriptions  Medication Sig Dispense Refill  . albuterol (PROAIR HFA) 108 (90 Base) MCG/ACT inhaler Inhale 2 puffs into the lungs every 4 (four) hours as needed for wheezing or shortness of breath. 1 Inhaler 2  . ALPRAZolam (XANAX) 0.25 MG tablet Take 1 tablet (0.25 mg total) by mouth 2 (two) times daily as needed for anxiety. 20 tablet 0  . busPIRone (BUSPAR) 5 MG tablet Take 1 tablet (5 mg total) by mouth daily. 30 tablet 2   No current facility-administered medications for this visit.     Allergies  Allergen Reactions  . Other Hives    Steri strips  . Tape Rash    Family History  Problem Relation Age of Onset  . Thyroid disease Mother   . Pancreatic cancer Mother   . Bladder Cancer Father   . Arthritis Maternal Grandmother   . Diabetes Maternal Grandmother   . Prostate cancer Maternal Grandfather   . Breast cancer Paternal Grandmother     Social History   Social History  . Marital status: Single    Spouse  name: N/A  . Number of children: N/A  . Years of education: N/A   Occupational History  . Not on file.   Social History Main Topics  . Smoking status: Never Smoker  . Smokeless tobacco: Never Used  . Alcohol use Yes     Comment: occasional  . Drug use: No  . Sexual activity: Yes   Other Topics Concern  . Not on file   Social History Narrative  . No narrative on file     Constitutional: Denies fever, malaise, fatigue, headache or abrupt weight changes.  Neurological: Denies dizziness, difficulty with memory, difficulty with speech or problems with balance and coordination.  Psych: Pt reports anxiety and depression. Denies SI/HI.  No other specific complaints in a complete review of systems (except as listed in HPI above).  Objective:   Physical Exam   BP 126/84   Pulse 74   Temp 98.7 F (37.1 C) (Oral)   Wt 246 lb (111.6 kg)   SpO2 98%   BMI 39.71 kg/m  Wt Readings from Last 3 Encounters:  09/16/16 246 lb (111.6 kg)  07/28/16 245 lb (111.1 kg)  04/30/16 250 lb (113.4 kg)    General: Appears her stated age, obese in NAD. Neurological: Alert and oriented.  Psychiatric: Mood and affect normal. Behavior is  normal. Judgment and thought content normal.    BMET    Component Value Date/Time   NA 140 07/28/2016 1455   NA 140 07/09/2012 0445   K 3.9 07/28/2016 1455   K 3.8 07/09/2012 0445   CL 104 07/28/2016 1455   CL 107 07/09/2012 0445   CO2 27 07/28/2016 1455   CO2 24 07/09/2012 0445   GLUCOSE 97 07/28/2016 1455   GLUCOSE 107 (H) 07/09/2012 0445   BUN 8 07/28/2016 1455   BUN 12 07/09/2012 0445   CREATININE 0.74 07/28/2016 1455   CREATININE 0.72 07/09/2012 0445   CALCIUM 9.6 07/28/2016 1455   CALCIUM 9.1 07/09/2012 0445   GFRNONAA >60 04/21/2016 1033   GFRNONAA >60 07/09/2012 0445   GFRAA >60 04/21/2016 1033   GFRAA >60 07/09/2012 0445    Lipid Panel     Component Value Date/Time   CHOL 173 07/28/2016 1455   TRIG 199.0 (H) 07/28/2016 1455   HDL  53.30 07/28/2016 1455   CHOLHDL 3 07/28/2016 1455   VLDL 39.8 07/28/2016 1455   LDLCALC 80 07/28/2016 1455    CBC    Component Value Date/Time   WBC 11.0 (H) 07/28/2016 1455   RBC 4.92 07/28/2016 1455   HGB 14.3 07/28/2016 1455   HGB 14.4 07/09/2012 0445   HCT 42.1 07/28/2016 1455   HCT 41.8 07/09/2012 0445   PLT 398.0 07/28/2016 1455   PLT 321 07/09/2012 0445   MCV 85.5 07/28/2016 1455   MCV 85 07/09/2012 0445   MCH 29.1 04/21/2016 1033   MCHC 33.9 07/28/2016 1455   RDW 12.7 07/28/2016 1455   RDW 13.3 07/09/2012 0445   LYMPHSABS 3.0 11/20/2014 1638   MONOABS 0.5 11/20/2014 1638   EOSABS 0.1 11/20/2014 1638   BASOSABS 0.2 (H) 11/20/2014 1638    Hgb A1C Lab Results  Component Value Date   HGBA1C 5.3 07/28/2016           Assessment & Plan:   Anxiety and depression:  Support offered today She is not interested in seeing a counselor or therapist at this time Will start Buspar, once daily for now RX for Xanax 0.25 mg to take daily prn for extreme anxiety. Discussed sedation caution and addictive properties. She understands that she is not to take this daily  Follow up with me via mychart in 4 weeks Nicki ReaperBAITY, REGINA, NP

## 2017-01-17 ENCOUNTER — Ambulatory Visit (INDEPENDENT_AMBULATORY_CARE_PROVIDER_SITE_OTHER): Payer: Commercial Managed Care - PPO | Admitting: Internal Medicine

## 2017-01-17 ENCOUNTER — Encounter: Payer: Self-pay | Admitting: Internal Medicine

## 2017-01-17 VITALS — BP 122/84 | HR 80 | Temp 97.8°F | Wt 250.0 lb

## 2017-01-17 DIAGNOSIS — N939 Abnormal uterine and vaginal bleeding, unspecified: Secondary | ICD-10-CM

## 2017-01-17 DIAGNOSIS — R1030 Lower abdominal pain, unspecified: Secondary | ICD-10-CM

## 2017-01-17 DIAGNOSIS — R3121 Asymptomatic microscopic hematuria: Secondary | ICD-10-CM | POA: Diagnosis not present

## 2017-01-17 DIAGNOSIS — N920 Excessive and frequent menstruation with regular cycle: Secondary | ICD-10-CM

## 2017-01-17 LAB — POCT URINE PREGNANCY: PREG TEST UR: NEGATIVE

## 2017-01-17 LAB — POC URINALSYSI DIPSTICK (AUTOMATED)
Bilirubin, UA: NEGATIVE
GLUCOSE UA: NEGATIVE
Ketones, UA: NEGATIVE
LEUKOCYTES UA: NEGATIVE
Nitrite, UA: NEGATIVE
PROTEIN UA: NEGATIVE
Spec Grav, UA: 1.03 (ref 1.030–1.035)
UROBILINOGEN UA: NEGATIVE (ref ?–2.0)
pH, UA: 6 (ref 5.0–8.0)

## 2017-01-17 NOTE — Progress Notes (Signed)
Subjective:    Patient ID: [name] Johnston, female    DOB: 08-01-1986, 31 y.o.   MRN: [id number]  HPI  Pt presents to the clinic today with c/o left lower abdominal pain. This started about 1 week ago. She describes the pain as intermittent sharp, stabbing pains. The pain does not radiate. She reports eating does ot affect this. She denies nausea, vomiting, diarrhea or constipation. She is having normal bowel movements. She does have irregular periods, but has had her tubes tied. She has noticed some vaginal bleeding when she wipes. Her last pap smear was normal. She has noticed some blood in her urine but denies urgency frequency or dysuria. She has drinking cranberry juice and water. She has not taken any medications OTC.  Review of Systems  Past Medical History:  Diagnosis Date  . Asthma    seasonal allergies  . Complication of anesthesia    nausea  . Obesity (BMI 30-39.9)   . Rash    Lt forearm circular    Current Outpatient Prescriptions  Medication Sig Dispense Refill  . albuterol (PROAIR HFA) 108 (90 Base) MCG/ACT inhaler Inhale 2 puffs into the lungs every 4 (four) hours as needed for wheezing or shortness of breath. 1 Inhaler 2  . ALPRAZolam (XANAX) 0.25 MG tablet Take 1 tablet (0.25 mg total) by mouth 2 (two) times daily as needed for anxiety. (Patient not taking: Reported on 01/17/2017) 20 tablet 0  . busPIRone (BUSPAR) 5 MG tablet Take 1 tablet (5 mg total) by mouth daily. (Patient not taking: Reported on 01/17/2017) 30 tablet 2   No current facility-administered medications for this visit.     Allergies  Allergen Reactions  . Other Hives    Steri strips  . Tape Rash    Family History  Problem Relation Age of Onset  . Thyroid disease Mother   . Pancreatic cancer Mother   . Bladder Cancer Father   . Arthritis Maternal Grandmother   . Diabetes Maternal Grandmother   . Prostate cancer Maternal Grandfather   . Breast cancer Paternal Grandmother     Social  History   Social History  . Marital status: Single    Spouse name: N/A  . Number of children: N/A  . Years of education: N/A   Occupational History  . Not on file.   Social History Main Topics  . Smoking status: Never Smoker  . Smokeless tobacco: Never Used  . Alcohol use Yes     Comment: occasional  . Drug use: No  . Sexual activity: Yes   Other Topics Concern  . Not on file   Social History Narrative  . No narrative on file     Constitutional: Denies fever, malaise, fatigue, headache or abrupt weight changes.  Gastrointestinal: Pt reports abdominal pain. Denies bloating, constipation, diarrhea or blood in the stool.  GU: Pt reports vaginal spotting and blood in her urine. Denies urgency, frequency, pain with urination, burning sensation, odor.  No other specific complaints in a complete review of systems (except as listed in HPI above).     Objective:   Physical Exam  BP 122/84   Pulse 80   Temp 97.8 F (36.6 C) (Oral)   Wt 250 lb (113.4 kg)   SpO2 98%   BMI 40.35 kg/m  Wt Readings from Last 3 Encounters:  01/17/17 250 lb (113.4 kg)  09/16/16 246 lb (111.6 kg)  07/28/16 245 lb (111.1 kg)    General: Appears her stated age, obese  in NAD. Abdomen: Soft and tender over the bladder. Normal bowel sounds. No distention or masses noted. No CVA tenderness noted.   BMET    Component Value Date/Time   NA 140 07/28/2016 1455   NA 140 07/09/2012 0445   K 3.9 07/28/2016 1455   K 3.8 07/09/2012 0445   CL 104 07/28/2016 1455   CL 107 07/09/2012 0445   CO2 27 07/28/2016 1455   CO2 24 07/09/2012 0445   GLUCOSE 97 07/28/2016 1455   GLUCOSE 107 (H) 07/09/2012 0445   BUN 8 07/28/2016 1455   BUN 12 07/09/2012 0445   CREATININE 0.74 07/28/2016 1455   CREATININE 0.72 07/09/2012 0445   CALCIUM 9.6 07/28/2016 1455   CALCIUM 9.1 07/09/2012 0445   GFRNONAA >60 04/21/2016 1033   GFRNONAA >60 07/09/2012 0445   GFRAA >60 04/21/2016 1033   GFRAA >60 07/09/2012 0445     Lipid Panel     Component Value Date/Time   CHOL 173 07/28/2016 1455   TRIG 199.0 (H) 07/28/2016 1455   HDL 53.30 07/28/2016 1455   CHOLHDL 3 07/28/2016 1455   VLDL 39.8 07/28/2016 1455   LDLCALC 80 07/28/2016 1455    CBC    Component Value Date/Time   WBC 11.0 (H) 07/28/2016 1455   RBC 4.92 07/28/2016 1455   HGB 14.3 07/28/2016 1455   HGB 14.4 07/09/2012 0445   HCT 42.1 07/28/2016 1455   HCT 41.8 07/09/2012 0445   PLT 398.0 07/28/2016 1455   PLT 321 07/09/2012 0445   MCV 85.5 07/28/2016 1455   MCV 85 07/09/2012 0445   MCH 29.1 04/21/2016 1033   MCHC 33.9 07/28/2016 1455   RDW 12.7 07/28/2016 1455   RDW 13.3 07/09/2012 0445   LYMPHSABS 3.0 11/20/2014 1638   MONOABS 0.5 11/20/2014 1638   EOSABS 0.1 11/20/2014 1638   BASOSABS 0.2 (H) 11/20/2014 1638    Hgb A1C Lab Results  Component Value Date   HGBA1C 5.3 07/28/2016            Assessment & Plan:   Abdominal pain, vaginal spotting, blood in urine:  Urinalysis: 3+ blood Will send urine culture Urine HcGL: negative ? Ovarian cyst She declines pelvic exam or ultrasound today Return precautions discussed  RTC as needed or if symptoms persist or worsen BAITY, REGINA, NP

## 2017-01-17 NOTE — Addendum Note (Signed)
Addended by: Roena MaladyEVONTENNO, MELANIE Y on: 01/17/2017 10:52 AM   Modules accepted: Orders

## 2017-01-17 NOTE — Patient Instructions (Addendum)
Ovarian Cyst °An ovarian cyst is a fluid-filled sac on an ovary. The ovaries are organs that make eggs in women. Most ovarian cysts go away on their own and are not cancerous (are benign). Some cysts need treatment. °Follow these instructions at home: °· Take over-the-counter and prescription medicines only as told by your doctor. °· Do not drive or use heavy machinery while taking prescription pain medicine. °· Get pelvic exams and Pap tests as often as told by your doctor. °· Return to your normal activities as told by your doctor. Ask your doctor what activities are safe for you. °· Do not use any products that contain nicotine or tobacco, such as cigarettes and e-cigarettes. If you need help quitting, ask your doctor. °· Keep all follow-up visits as told by your doctor. This is important. °Contact a doctor if: °· Your periods are: °¨ Late. °¨ Irregular. °¨ Painful. °· Your periods stop. °· You have pelvic pain that does not go away. °· You have pressure on your bladder. °· You have trouble making your bladder empty when you pee (urinate). °· You have pain during sex. °· You have any of the following in your belly (abdomen): °¨ A feeling of fullness. °¨ Pressure. °¨ Discomfort. °¨ Pain that does not go away. °¨ Swelling. °· You feel sick most of the time. °· You have trouble pooping (have constipation). °· You are not as hungry as usual (you lose your appetite). °· You get very bad acne. °· You start to have more hair on your body and face. °· You are gaining weight or losing weight without changing your exercise and eating habits. °· You think you may be pregnant. °Get help right away if: °· You have belly pain that is very bad or gets worse. °· You cannot eat or drink without throwing up (vomiting). °· You suddenly get a fever. °· Your period is a lot heavier than usual. °This information is not intended to replace advice given to you by your health care provider. Make sure you discuss any questions you have  with your health care provider. °Document Released: 04/05/2008 Document Revised: 05/07/2016 Document Reviewed: 03/21/2016 °Elsevier Interactive Patient Education © 2017 [employer] ° °

## 2017-01-18 ENCOUNTER — Encounter: Payer: Self-pay | Admitting: Internal Medicine

## 2017-01-18 DIAGNOSIS — N939 Abnormal uterine and vaginal bleeding, unspecified: Secondary | ICD-10-CM

## 2017-01-18 DIAGNOSIS — R102 Pelvic and perineal pain: Secondary | ICD-10-CM

## 2017-01-19 ENCOUNTER — Other Ambulatory Visit: Payer: Self-pay | Admitting: Internal Medicine

## 2017-01-19 LAB — URINE CULTURE

## 2017-01-19 MED ORDER — CIPROFLOXACIN HCL 500 MG PO TABS: 500.0000 mg | ORAL_TABLET | Freq: Two times a day (BID) | ORAL | 0 refills | Status: DC

## 2017-01-21 ENCOUNTER — Ambulatory Visit
Admission: RE | Admit: 2017-01-21 | Discharge: 2017-01-21 | Disposition: A | Discharge status: Home / Self Care | Payer: Commercial Managed Care - PPO | Source: Ambulatory Visit | Attending: Internal Medicine | Admitting: Internal Medicine

## 2017-01-21 DIAGNOSIS — R102 Pelvic and perineal pain: Secondary | ICD-10-CM | POA: Diagnosis not present

## 2017-01-21 DIAGNOSIS — N83291 Other ovarian cyst, right side: Secondary | ICD-10-CM | POA: Diagnosis not present

## 2017-01-21 DIAGNOSIS — N920 Excessive and frequent menstruation with regular cycle: Secondary | ICD-10-CM | POA: Diagnosis not present

## 2017-01-21 DIAGNOSIS — N939 Abnormal uterine and vaginal bleeding, unspecified: Secondary | ICD-10-CM

## 2017-03-31 ENCOUNTER — Encounter: Payer: Self-pay | Admitting: Internal Medicine

## 2017-05-23 ENCOUNTER — Encounter: Payer: Self-pay | Admitting: Internal Medicine

## 2017-05-23 MED ORDER — NORETHIN-ETH ESTRAD-FE BIPHAS 1 MG-10 MCG / 10 MCG PO TABS: 1.0000 | ORAL_TABLET | Freq: Every day | ORAL | 11 refills | Status: DC

## 2017-06-12 ENCOUNTER — Encounter: Payer: Self-pay | Admitting: Emergency Medicine

## 2017-06-12 DIAGNOSIS — N938 Other specified abnormal uterine and vaginal bleeding: Secondary | ICD-10-CM | POA: Diagnosis present

## 2017-06-12 DIAGNOSIS — Z79899 Other long term (current) drug therapy: Secondary | ICD-10-CM | POA: Diagnosis not present

## 2017-06-12 DIAGNOSIS — J45909 Unspecified asthma, uncomplicated: Secondary | ICD-10-CM | POA: Diagnosis not present

## 2017-06-12 DIAGNOSIS — R102 Pelvic and perineal pain: Secondary | ICD-10-CM | POA: Insufficient documentation

## 2017-06-12 LAB — CBC WITH DIFFERENTIAL/PLATELET
Basophils Absolute: 0.1 10*3/uL (ref 0–0.1)
Basophils Relative: 1 %
EOS PCT: 1 %
Eosinophils Absolute: 0.1 10*3/uL (ref 0–0.7)
HCT: 37 % (ref 35.0–47.0)
Hemoglobin: 12.8 g/dL (ref 12.0–16.0)
LYMPHS ABS: 3.9 10*3/uL — AB (ref 1.0–3.6)
LYMPHS PCT: 39 %
MCH: 28.3 pg (ref 26.0–34.0)
MCHC: 34.5 g/dL (ref 32.0–36.0)
MCV: 82.1 fL (ref 80.0–100.0)
Monocytes Absolute: 0.5 10*3/uL (ref 0.2–0.9)
Monocytes Relative: 5 %
Neutro Abs: 5.5 10*3/uL (ref 1.4–6.5)
Neutrophils Relative %: 54 %
PLATELETS: 480 10*3/uL — AB (ref 150–440)
RBC: 4.51 MIL/uL (ref 3.80–5.20)
RDW: 13.9 % (ref 11.5–14.5)
WBC: 10 10*3/uL (ref 3.6–11.0)

## 2017-06-12 LAB — BASIC METABOLIC PANEL
Anion gap: 6 (ref 5–15)
BUN: 14 mg/dL (ref 6–20)
CHLORIDE: 108 mmol/L (ref 101–111)
CO2: 25 mmol/L (ref 22–32)
Calcium: 9.2 mg/dL (ref 8.9–10.3)
Creatinine, Ser: 0.9 mg/dL (ref 0.44–1.00)
GFR calc Af Amer: >60 mL/min (ref >60)
GLUCOSE: 107 mg/dL — AB (ref 65–99)
POTASSIUM: 3.8 mmol/L (ref 3.5–5.1)
Sodium: 139 mmol/L (ref 135–145)

## 2017-06-12 NOTE — ED Triage Notes (Signed)
Pt arrives ambulatory to triage with c/o vaginal bleeding x 1 1/2 months. Pt reports that she had bleeding for a month and then her OBGYN whom she has an appointment with on Wednesday put her on birthcontrol which temporarily stopped the bleeding. Pt reports that the pills make her sick and she stopped them and the bleeding recommenced. Pt also reports using 16 tampons today and taking 16 200 mg ibuprofen for the cramping. Pt is in NAD at this time.

## 2017-06-13 ENCOUNTER — Emergency Department: Payer: Commercial Managed Care - PPO

## 2017-06-13 ENCOUNTER — Encounter: Payer: Self-pay | Admitting: Internal Medicine

## 2017-06-13 ENCOUNTER — Emergency Department
Admission: EM | Admit: 2017-06-13 | Discharge: 2017-06-13 | Disposition: A | Discharge status: Home / Self Care | Payer: Commercial Managed Care - PPO | Attending: Emergency Medicine | Admitting: Emergency Medicine

## 2017-06-13 DIAGNOSIS — R52 Pain, unspecified: Secondary | ICD-10-CM

## 2017-06-13 DIAGNOSIS — R102 Pelvic and perineal pain: Secondary | ICD-10-CM

## 2017-06-13 DIAGNOSIS — N939 Abnormal uterine and vaginal bleeding, unspecified: Secondary | ICD-10-CM

## 2017-06-13 DIAGNOSIS — N938 Other specified abnormal uterine and vaginal bleeding: Secondary | ICD-10-CM

## 2017-06-13 LAB — HCG, QUANTITATIVE, PREGNANCY

## 2017-06-13 LAB — SALICYLATE LEVEL: Salicylate Lvl: <7 mg/dL (ref 2.8–30.0)

## 2017-06-13 MED ORDER — MEDROXYPROGESTERONE ACETATE 10 MG PO TABS: 10.0000 mg | ORAL_TABLET | Freq: Every day | ORAL | 0 refills | Status: DC

## 2017-06-13 MED ORDER — ONDANSETRON 4 MG PO TBDP: 4.0000 mg | ORAL_TABLET | Freq: Three times a day (TID) | ORAL | 0 refills | Status: DC | PRN

## 2017-06-13 NOTE — ED Notes (Signed)
Recollect red lab tube sent

## 2017-06-13 NOTE — ED Notes (Signed)
Patient transported to Ultrasound 

## 2017-06-13 NOTE — Discharge Instructions (Signed)
1. Start 10 day course of Provera. You may take Zofran as needed. 2. Return to the ER for worsening symptoms, persistent vomiting, feeling faint, soaking more than 1 pad per hour, or other concerns.

## 2017-06-13 NOTE — ED Provider Notes (Signed)
[facility] Emergency Department Provider Note   ____________________________________________   First MD Initiated Contact with Patient 06/13/17 [phone]     (approximate)  I have reviewed the triage vital signs and the nursing notes.   HISTORY  Chief Complaint Vaginal Bleeding    HPI [name] Johnston is a 31 y.o. female who presents to the ED from home with a chie f complaint of pelvic pain. Patient reports dysfunctional uterine bleeding for the past 6 weeks. Her PCP phoned in a prescription for birth control which patient took but then stop secondary to nausea. Reports using multiple tampons per day and taking a lot of ibuprofen for pelvic cramping.No prior history of pelvic pathology, the patient states periods are often irregular. Denies recent fever, chills, chest pain, shortness of breath, nausea, vomiting, dysuria, lightheadedness. Has been taking iron pills. Denies recent travel or trauma. Nothing makes her symptoms better or worse.   Past Medical History:  Diagnosis Date  . Asthma    seasonal allergies  . Complication of anesthesia    nausea  . Obesity (BMI 30-39.9)   . Rash    Lt forearm circular    Patient Active Problem List   Diagnosis Date Noted  . Cough variant asthma 03/31/2016  . Family history of pancreatic cancer 09/05/2015  . Family history of bladder cancer 09/05/2015  . Family history of breast cancer 09/05/2015  . Adjustment disorder with mixed anxiety and depressed mood 09/05/2015  . Obesity (BMI 35.0-39.9 without comorbidity) 07/25/2014  . Plantar fasciitis, bilateral 07/25/2014    Past Surgical History:  Procedure Laterality Date  . CESAREAN SECTION     two   . CHOLECYSTECTOMY    . LAPAROSCOPIC TUBAL LIGATION Bilateral 04/30/2016   Procedure: LAPAROSCOPIC TUBAL LIGATION;  Surgeon: Suzy Bouchard, MD;  Location: [facility] ORS;  Service: Gynecology;  Laterality: Bilateral;    Prior to Admission medications     Medication Sig Start Date End Date Taking? Authorizing Provider  albuterol (PROAIR HFA) 108 (90 Base) MCG/ACT inhaler Inhale 2 puffs into the lungs every 4 (four) hours as needed for wheezing or shortness of breath. 03/31/16   Sheliah Hatch, MD  ALPRAZolam Prudy Feeler) 0.25 MG tablet Take 1 tablet (0.25 mg total) by mouth 2 (two) times daily as needed for anxiety. Patient not taking: Reported on 01/17/2017 09/16/16   Lorre Munroe, NP  busPIRone (BUSPAR) 5 MG tablet Take 1 tablet (5 mg total) by mouth daily. Patient not taking: Reported on 01/17/2017 09/16/16   Lorre Munroe, NP  ciprofloxacin (CIPRO) 500 MG tablet Take 1 tablet (500 mg total) by mouth 2 (two) times daily. 01/19/17   Lorre Munroe, NP  medroxyPROGESTERone (PROVERA) 10 MG tablet Take 1 tablet (10 mg total) by mouth daily. 06/13/17   Irean Hong, MD  Norethindrone-Ethinyl Estradiol-Fe Biphas (LO LOESTRIN FE) 1 MG-10 MCG / 10 MCG tablet Take 1 tablet by mouth daily. 05/23/17   Lorre Munroe, NP  ondansetron (ZOFRAN ODT) 4 MG disintegrating tablet Take 1 tablet (4 mg total) by mouth every 8 (eight) hours as needed for nausea or vomiting. 06/13/17   Irean Hong, MD    Allergies Other and Tape  Family History  Problem Relation Age of Onset  . Thyroid disease Mother   . Pancreatic cancer Mother   . Bladder Cancer Father   . Arthritis Maternal Grandmother   . Diabetes Maternal Grandmother   . Prostate cancer Maternal Grandfather   . Breast  cancer Paternal Grandmother     Social History Social History  Substance Use Topics  . Smoking status: Never Smoker  . Smokeless tobacco: Never Used  . Alcohol use Yes     Comment: occasional    Review of Systems  Constitutional: No fever/chills. Eyes: No visual changes. ENT: No sore throat. Cardiovascular: Denies chest pain. Respiratory: Denies shortness of breath. Gastrointestinal: No abdominal pain.  No nausea, no vomiting.  No diarrhea.  No constipation. Genitourinary:  Positive for abnormal vaginal bleeding. Negative for dysuria. Musculoskeletal: Negative for back pain. Skin: Negative for rash. Neurological: Negative for headaches, focal weakness or numbness.   ____________________________________________   PHYSICAL EXAM:  VITAL SIGNS: ED Triage Vitals  Enc Vitals Group     BP 06/12/17 2323 (!) 132/94     Pulse Rate 06/12/17 2323 84     Resp 06/12/17 2323 18     Temp 06/12/17 2323 97.8 F (36.6 C)     Temp Source 06/12/17 2323 Oral     SpO2 06/12/17 2323 100 %     Weight 06/12/17 2323 230 lb (104.3 kg)     Height 06/12/17 2323 5\' 7"  (1.702 m)     Head Circumference --      Peak Flow --      Pain Score 06/12/17 2322 5     Pain Loc --      Pain Edu? --      Excl. in GC? --     Constitutional: Alert and oriented. Well appearing and in no acute distress. Eyes: Conjunctivae are normal. PERRL. EOMI. Head: Atraumatic. Nose: No congestion/rhinnorhea. Mouth/Throat: Mucous membranes are moist.  Oropharynx non-erythematous. Neck: No stridor.   Cardiovascular: Normal rate, regular rhythm. Grossly normal heart sounds.  Good peripheral circulation. Respiratory: Normal respiratory effort.  No retractions. Lungs CTAB. Gastrointestinal: Pelvis mildly tender to palpation without rebound or guarding. Soft and nontender. No distention. No abdominal bruits. No CVA tenderness. Musculoskeletal: No lower extremity tenderness nor edema.  No joint effusions. Neurologic:  Normal speech and language. No gross focal neurologic deficits are appreciated. No gait instability. Skin:  Skin is warm, dry and intact. No rash noted. Psychiatric: Mood and affect are normal. Speech and behavior are normal.  ____________________________________________   LABS (all labs ordered are listed, but only abnormal results are displayed)  Labs Reviewed  CBC WITH DIFFERENTIAL/PLATELET - Abnormal; Notable for the following:       Result Value   Platelets 480 (*)    Lymphs Abs  3.9 (*)    All other components within normal limits  BASIC METABOLIC PANEL - Abnormal; Notable for the following:    Glucose, Bld 107 (*)    All other components within normal limits  HCG, QUANTITATIVE, PREGNANCY  SALICYLATE LEVEL   ____________________________________________  EKG  None ____________________________________________  RADIOLOGY  US Transvaginal Non-ob  Result Date: 06/13/2017 CLINICAL DATA:  Heavy vaginal bleeding for 1 month EXAM: TRANSABDOMINAL AND TRANSVAGINAL ULTRASOUND OF PELVIS DOPPLER ULTRASOUND OF OVARIES TECHNIQUE: Both transabdominal and transvaginal ultrasound examinations of the pelvis were performed. Transabdominal technique was performed for global imaging of the pelvis including uterus, ovaries, adnexal regions, and pelvic cul-de-sac. It was necessary to proceed with endovaginal exam following the transabdominal exam to visualize the ovaries. Color and duplex Doppler ultrasound was utilized to evaluate blood flow to the ovaries. COMPARISON:  01/21/2017 FINDINGS: Uterus Measurements: 11.5 x 5.5 x 5.7 cm. Multiple nabothian cysts in the cervix. Endometrium Thickness: 10 mm.  No focal abnormality visualized. Right ovary  Non visualized Left ovary Measurements: 2.7 x 1.7 x 2.2 cm. Normal appearance/no adnexal mass. Pulsed Doppler evaluation of both ovaries demonstrates normal low-resistance arterial and venous waveforms. Other findings Small free fluid in the pelvis. IMPRESSION: 1. Negative for left ovarian torsion.  Nonvisualized right ovary. 2. Endometrial thickness is 10 mm. If bleeding remains unresponsive to hormonal or medical therapy, sonohysterogram should be considered for focal lesion work-up. (Ref: Radiological Reasoning: Algorithmic Workup of Abnormal Vaginal Bleeding with Endovaginal Sonography and Sonohysterography. AJR 2008; 742:V95-63) 3. Small amount of free fluid in the pelvis Electronically Signed   By: Jasmine Pang M.D.   On: 06/13/2017 03:05   US  Pelvis Complete  Result Date: 06/13/2017 CLINICAL DATA:  Heavy vaginal bleeding for 1 month EXAM: TRANSABDOMINAL AND TRANSVAGINAL ULTRASOUND OF PELVIS DOPPLER ULTRASOUND OF OVARIES TECHNIQUE: Both transabdominal and transvaginal ultrasound examinations of the pelvis were performed. Transabdominal technique was performed for global imaging of the pelvis including uterus, ovaries, adnexal regions, and pelvic cul-de-sac. It was necessary to proceed with endovaginal exam following the transabdominal exam to visualize the ovaries. Color and duplex Doppler ultrasound was utilized to evaluate blood flow to the ovaries. COMPARISON:  01/21/2017 FINDINGS: Uterus Measurements: 11.5 x 5.5 x 5.7 cm. Multiple nabothian cysts in the cervix. Endometrium Thickness: 10 mm.  No focal abnormality visualized. Right ovary Non visualized Left ovary Measurements: 2.7 x 1.7 x 2.2 cm. Normal appearance/no adnexal mass. Pulsed Doppler evaluation of both ovaries demonstrates normal low-resistance arterial and venous waveforms. Other findings Small free fluid in the pelvis. IMPRESSION: 1. Negative for left ovarian torsion.  Nonvisualized right ovary. 2. Endometrial thickness is 10 mm. If bleeding remains unresponsive to hormonal or medical therapy, sonohysterogram should be considered for focal lesion work-up. (Ref: Radiological Reasoning: Algorithmic Workup of Abnormal Vaginal Bleeding with Endovaginal Sonography and Sonohysterography. AJR 2008; 875:I43-32) 3. Small amount of free fluid in the pelvis Electronically Signed   By: Jasmine Pang M.D.   On: 06/13/2017 03:05   [place] Art/ven Flow Abd Pelv Doppler  Result Date: 06/13/2017 CLINICAL DATA:  Heavy vaginal bleeding for 1 month EXAM: TRANSABDOMINAL AND TRANSVAGINAL ULTRASOUND OF PELVIS DOPPLER ULTRASOUND OF OVARIES TECHNIQUE: Both transabdominal and transvaginal ultrasound examinations of the pelvis were performed. Transabdominal technique was performed for global imaging of the pelvis  including uterus, ovaries, adnexal regions, and pelvic cul-de-sac. It was necessary to proceed with endovaginal exam following the transabdominal exam to visualize the ovaries. Color and duplex Doppler ultrasound was utilized to evaluate blood flow to the ovaries. COMPARISON:  01/21/2017 FINDINGS: Uterus Measurements: 11.5 x 5.5 x 5.7 cm. Multiple nabothian cysts in the cervix. Endometrium Thickness: 10 mm.  No focal abnormality visualized. Right ovary Non visualized Left ovary Measurements: 2.7 x 1.7 x 2.2 cm. Normal appearance/no adnexal mass. Pulsed Doppler evaluation of both ovaries demonstrates normal low-resistance arterial and venous waveforms. Other findings Small free fluid in the pelvis. IMPRESSION: 1. Negative for left ovarian torsion.  Nonvisualized right ovary. 2. Endometrial thickness is 10 mm. If bleeding remains unresponsive to hormonal or medical therapy, sonohysterogram should be considered for focal lesion work-up. (Ref: Radiological Reasoning: Algorithmic Workup of Abnormal Vaginal Bleeding with Endovaginal Sonography and Sonohysterography. AJR 2008; 951:O84-16) 3. Small amount of free fluid in the pelvis Electronically Signed   By: Jasmine Pang M.D.   On: 06/13/2017 03:05    ____________________________________________   PROCEDURES  Procedure(s) performed:   Pelvic exam: External exam WNL without rashes, lesions or vesicles. Speculum exam reveals mild bleeding without clots.  Cervical os closed. Bimanual exam WNL.  Procedures  Critical Care performed: No  ____________________________________________   INITIAL IMPRESSION / ASSESSMENT AND PLAN / ED COURSE  Pertinent labs & imaging results that were available during my care of the patient were reviewed by me and considered in my medical decision making (see chart for details).  31 year old female with dysfunctional uterine bleeding. Laboratory and imaging results unremarkable. Will place on 10 day course of Provera and  referred to GYN for outpatient follow-up. Strict return precautions given. Patient verbalizes understanding and agrees with plan of care.      ____________________________________________   FINAL CLINICAL IMPRESSION(S) / ED DIAGNOSES  Final diagnoses:  Pain  Dysfunctional uterine bleeding  Pelvic pain      NEW MEDICATIONS STARTED DURING THIS VISIT:  New Prescriptions   MEDROXYPROGESTERONE (PROVERA) 10 MG TABLET    Take 1 tablet (10 mg total) by mouth daily.   ONDANSETRON (ZOFRAN ODT) 4 MG DISINTEGRATING TABLET    Take 1 tablet (4 mg total) by mouth every 8 (eight) hours as needed for nausea or vomiting.     Note:  This document was prepared using Dragon voice recognition software and may include unintentional dictation errors.    Irean HongSung, Jade J, MD 06/13/17 [phone]

## 2017-06-13 NOTE — ED Notes (Signed)
Pt states she has taken 4( 200mg ) of Ibuprofen 4X today

## 2017-06-13 NOTE — ED Notes (Signed)
Pt back from US

## 2017-06-15 ENCOUNTER — Encounter: Payer: Self-pay | Admitting: Advanced Practice Midwife

## 2017-07-22 ENCOUNTER — Encounter: Payer: Commercial Managed Care - PPO | Admitting: Internal Medicine

## 2017-07-22 DIAGNOSIS — Z0289 Encounter for other administrative examinations: Secondary | ICD-10-CM

## 2017-07-22 NOTE — Progress Notes (Deleted)
Subjective:    Patient ID: [name] Johnston, female    DOB: 12-25-1985, 31 y.o.   MRN: [id number]  HPI  Pt presents to the clinic today for her annual exam.  Flu: Tetanus: Pap Smear: 11/2014 Dentist:  Diet: Exercise:  Review of Systems  Past Medical History:  Diagnosis Date  . Asthma    seasonal allergies  . Complication of anesthesia    nausea  . Obesity (BMI 30-39.9)   . Rash    Lt forearm circular    Current Outpatient Prescriptions  Medication Sig Dispense Refill  . albuterol (PROAIR HFA) 108 (90 Base) MCG/ACT inhaler Inhale 2 puffs into the lungs every 4 (four) hours as needed for wheezing or shortness of breath. 1 Inhaler 2  . ALPRAZolam (XANAX) 0.25 MG tablet Take 1 tablet (0.25 mg total) by mouth 2 (two) times daily as needed for anxiety. (Patient not taking: Reported on 01/17/2017) 20 tablet 0  . busPIRone (BUSPAR) 5 MG tablet Take 1 tablet (5 mg total) by mouth daily. (Patient not taking: Reported on 01/17/2017) 30 tablet 2  . ciprofloxacin (CIPRO) 500 MG tablet Take 1 tablet (500 mg total) by mouth 2 (two) times daily. 10 tablet 0  . medroxyPROGESTERone (PROVERA) 10 MG tablet Take 1 tablet (10 mg total) by mouth daily. 10 tablet 0  . Norethindrone-Ethinyl Estradiol-Fe Biphas (LO LOESTRIN FE) 1 MG-10 MCG / 10 MCG tablet Take 1 tablet by mouth daily. 1 Package 11  . ondansetron (ZOFRAN ODT) 4 MG disintegrating tablet Take 1 tablet (4 mg total) by mouth every 8 (eight) hours as needed for nausea or vomiting. 20 tablet 0   No current facility-administered medications for this visit.     Allergies  Allergen Reactions  . Other Hives    Steri strips  . Tape Rash    Family History  Problem Relation Age of Onset  . Thyroid disease Mother   . Pancreatic cancer Mother   . Bladder Cancer Father   . Arthritis Maternal Grandmother   . Diabetes Maternal Grandmother   . Prostate cancer Maternal Grandfather   . Breast cancer Paternal Grandmother     Social History    Social History  . Marital status: Single    Spouse name: N/A  . Number of children: N/A  . Years of education: N/A   Occupational History  . Not on file.   Social History Main Topics  . Smoking status: Never Smoker  . Smokeless tobacco: Never Used  . Alcohol use Yes     Comment: occasional  . Drug use: No  . Sexual activity: Yes   Other Topics Concern  . Not on file   Social History Narrative  . No narrative on file     Constitutional: Denies fever, malaise, fatigue, headache or abrupt weight changes.  HEENT: Denies eye pain, eye redness, ear pain, ringing in the ears, wax buildup, runny nose, nasal congestion, bloody nose, or sore throat. Respiratory: Denies difficulty breathing, shortness of breath, cough or sputum production.   Cardiovascular: Denies chest pain, chest tightness, palpitations or swelling in the hands or feet.  Gastrointestinal: Denies abdominal pain, bloating, constipation, diarrhea or blood in the stool.  GU: Denies urgency, frequency, pain with urination, burning sensation, blood in urine, odor or discharge. Musculoskeletal: Denies decrease in range of motion, difficulty with gait, muscle pain or joint pain and swelling.  Skin: Denies redness, rashes, lesions or ulcercations.  Neurological: Denies dizziness, difficulty with memory, difficulty with speech or problems  with balance and coordination.  Psych: Denies anxiety, depression, SI/HI.  No other specific complaints in a complete review of systems (except as listed in HPI above).     Objective:   Physical Exam        Assessment & Plan:

## 2017-07-28 ENCOUNTER — Encounter: Payer: Self-pay | Admitting: Internal Medicine

## 2017-07-28 ENCOUNTER — Encounter (INDEPENDENT_AMBULATORY_CARE_PROVIDER_SITE_OTHER): Payer: Self-pay

## 2017-07-28 ENCOUNTER — Ambulatory Visit (INDEPENDENT_AMBULATORY_CARE_PROVIDER_SITE_OTHER): Payer: Commercial Managed Care - PPO | Admitting: Internal Medicine

## 2017-07-28 VITALS — BP 128/86 | HR 93 | Temp 97.9°F | Ht 66.0 in | Wt 259.0 lb

## 2017-07-28 DIAGNOSIS — J4520 Mild intermittent asthma, uncomplicated: Secondary | ICD-10-CM | POA: Diagnosis not present

## 2017-07-28 DIAGNOSIS — B369 Superficial mycosis, unspecified: Secondary | ICD-10-CM | POA: Diagnosis not present

## 2017-07-28 DIAGNOSIS — G43109 Migraine with aura, not intractable, without status migrainosus: Secondary | ICD-10-CM

## 2017-07-28 DIAGNOSIS — F419 Anxiety disorder, unspecified: Secondary | ICD-10-CM

## 2017-07-28 DIAGNOSIS — Z0000 Encounter for general adult medical examination without abnormal findings: Secondary | ICD-10-CM | POA: Diagnosis not present

## 2017-07-28 MED ORDER — SUMATRIPTAN SUCCINATE 25 MG PO TABS: 25.0000 mg | ORAL_TABLET | ORAL | 0 refills | Status: DC | PRN

## 2017-07-28 MED ORDER — ESCITALOPRAM OXALATE 10 MG PO TABS: 10.0000 mg | ORAL_TABLET | Freq: Every day | ORAL | 5 refills | Status: DC

## 2017-07-28 NOTE — Progress Notes (Signed)
Subjective:    Patient ID: [name] Johnston, female    DOB: [birth year], 31 y.o.   MRN: [id number]  HPI  Pt presents to the clinic today for her annual exam. She is also due for follow up of chronic conditions.  Allergy Induced Asthma: She takes an antihistamine OTC and Albuterol as needed with good relief.   Anxiety: She reports she stopped taking the Buspar because she couldn't remember to take it. She takes the Xanax 1-2 times per week.  Migraine: She reports these start out by her vision being blurred, then it turns into a sharp, stabbing headache on one side of her head. She reports she has to lay in a dark room to get them to go away. She has tried Fioricet with some relief.  She also c/o a fungal rash on her upper chest and bilateral arms. She noticed this 2 months ago. The rash itches. She has tried Hydrocortisone cream with minimal relief.  Flu: 08/2016 Tetanus: unsure Pap Smear: 11/2014 Dentist: as needed  Diet: She does eat meat. She consumes more fruits than veggies. She occasionally eats fried foods. She drinks water, coffee, sweet tea. Exercise: None  Review of Systems      Past Medical History:  Diagnosis Date  . Asthma    seasonal allergies  . Complication of anesthesia    nausea  . Obesity (BMI 30-39.9)   . Rash    Lt forearm circular    Current Outpatient Prescriptions  Medication Sig Dispense Refill  . albuterol (PROAIR HFA) 108 (90 Base) MCG/ACT inhaler Inhale 2 puffs into the lungs every 4 (four) hours as needed for wheezing or shortness of breath. 1 Inhaler 2  . ALPRAZolam (XANAX) 0.25 MG tablet Take 1 tablet (0.25 mg total) by mouth 2 (two) times daily as needed for anxiety. (Patient not taking: Reported on 01/17/2017) 20 tablet 0  . busPIRone (BUSPAR) 5 MG tablet Take 1 tablet (5 mg total) by mouth daily. (Patient not taking: Reported on 01/17/2017) 30 tablet 2  . ciprofloxacin (CIPRO) 500 MG tablet Take 1 tablet (500 mg total) by mouth 2 (two) times  daily. 10 tablet 0  . medroxyPROGESTERone (PROVERA) 10 MG tablet Take 1 tablet (10 mg total) by mouth daily. 10 tablet 0  . Norethindrone-Ethinyl Estradiol-Fe Biphas (LO LOESTRIN FE) 1 MG-10 MCG / 10 MCG tablet Take 1 tablet by mouth daily. 1 Package 11  . ondansetron (ZOFRAN ODT) 4 MG disintegrating tablet Take 1 tablet (4 mg total) by mouth every 8 (eight) hours as needed for nausea or vomiting. 20 tablet 0   No current facility-administered medications for this visit.     Allergies  Allergen Reactions  . Other Hives    Steri strips  . Tape Rash    Family History  Problem Relation Age of Onset  . Thyroid disease Mother   . Pancreatic cancer Mother   . Bladder Cancer Father   . Arthritis Maternal Grandmother   . Diabetes Maternal Grandmother   . Prostate cancer Maternal Grandfather   . Breast cancer Paternal Grandmother     Social History   Social History  . Marital status: Single    Spouse name: N/A  . Number of children: N/A  . Years of education: N/A   Occupational History  . Not on file.   Social History Main Topics  . Smoking status: Never Smoker  . Smokeless tobacco: Never Used  . Alcohol use Yes     Comment: occasional  .  Drug use: No  . Sexual activity: Yes   Other Topics Concern  . Not on file   Social History Narrative  . No narrative on file     Constitutional: Denies fever, malaise, fatigue, headache or abrupt weight changes.  HEENT: Denies eye pain, eye redness, ear pain, ringing in the ears, wax buildup, runny nose, nasal congestion, bloody nose, or sore throat. Respiratory: Denies difficulty breathing, shortness of breath, cough or sputum production.   Cardiovascular: Denies chest pain, chest tightness, palpitations or swelling in the hands or feet.  Gastrointestinal: Denies abdominal pain, bloating, constipation, diarrhea or blood in the stool.  GU: Denies urgency, frequency, pain with urination, burning sensation, blood in urine, odor or  discharge. Musculoskeletal: Denies decrease in range of motion, difficulty with gait, muscle pain or joint pain and swelling.  Skin: Pt reports rash. Denies ulcercations.  Neurological: Denies dizziness, difficulty with memory, difficulty with speech or problems with balance and coordination.  Psych: Pt reports anxiety. Denies depression, SI/HI.  No other specific complaints in a complete review of systems (except as listed in HPI above).  Objective:   Physical Exam  BP 128/86   Pulse 93   Temp 97.9 F (36.6 C) (Oral)   Ht  (1.676 m)   Wt 259 lb (117.5 kg)   SpO2 98%   BMI 41.80 kg/m  Wt Readings from Last 3 Encounters:  07/28/17 259 lb (117.5 kg)  06/12/17 230 lb (104.3 kg)  01/17/17 250 lb (113.4 kg)    General: Appears her stated age, obese in NAD. Skin: Warm, dry and intact. Grouped annular lesions with central clearing noted on upper chest and bilateral antecubital fossa. HEENT: Head: normal shape and size; Eyes: sclera white, no icterus, conjunctiva pink, PERRLA and EOMs intact; Ears: Tm's gray and intact, normal light reflex; Throat/Mouth: Teeth present, mucosa pink and moist, no exudate, lesions or ulcerations noted.  Neck:  Neck supple, trachea midline. No masses, lumps or thyromegaly present.  Cardiovascular: Normal rate and rhythm. S1,S2 noted.  No murmur, rubs or gallops noted. No JVD or BLE edema. Pulmonary/Chest: Normal effort and positive vesicular breath sounds. No respiratory distress. No wheezes, rales or ronchi noted.  Abdomen: Soft and nontender. Normal bowel sounds. No distention or masses noted. Liver, spleen and kidneys non palpable. Musculoskeletal: Strength 5/5 BUE/BLE. No difficulty with gait.  Neurological: Alert and oriented. Cranial nerves II-XII grossly intact. Coordination normal.  Psychiatric: Mood and affect normal. Behavior is normal. Judgment and thought content normal.    BMET    Component Value Date/Time   NA 135 07/28/2017 1647   NA  140 07/09/2012 0445   K 4.0 07/28/2017 1647   K 3.8 07/09/2012 0445   CL 102 07/28/2017 1647   CL 107 07/09/2012 0445   CO2 25 07/28/2017 1647   CO2 24 07/09/2012 0445   GLUCOSE 79 07/28/2017 1647   GLUCOSE 107 (H) 07/09/2012 0445   BUN 10 07/28/2017 1647   BUN 12 07/09/2012 0445   CREATININE 0.70 07/28/2017 1647   CREATININE 0.72 07/09/2012 0445   CALCIUM 9.8 07/28/2017 1647   CALCIUM 9.1 07/09/2012 0445   GFRNONAA >60 06/12/2017 2323   GFRNONAA >60 07/09/2012 0445   GFRAA >60 06/12/2017 2323   GFRAA >60 07/09/2012 0445    Lipid Panel     Component Value Date/Time   CHOL 207 (H) 07/28/2017 1647   TRIG 200.0 (H) 07/28/2017 1647   HDL 51.90 07/28/2017 1647   CHOLHDL 4 07/28/2017 1647  VLDL 40.0 07/28/2017 1647   LDLCALC 115 (H) 07/28/2017 1647    CBC    Component Value Date/Time   WBC 9.2 07/28/2017 1647   RBC 4.65 07/28/2017 1647   HGB 12.2 07/28/2017 1647   HGB 14.4 07/09/2012 0445   HCT 37.5 07/28/2017 1647   HCT 41.8 07/09/2012 0445   PLT 489.0 (H) 07/28/2017 1647   PLT 321 07/09/2012 0445   MCV 80.6 07/28/2017 1647   MCV 85 07/09/2012 0445   MCH 28.3 06/12/2017 2323   MCHC 32.5 07/28/2017 1647   RDW 14.5 07/28/2017 1647   RDW 13.3 07/09/2012 0445   LYMPHSABS 3.9 (H) 06/12/2017 2323   MONOABS 0.5 06/12/2017 2323   EOSABS 0.1 06/12/2017 2323   BASOSABS 0.1 06/12/2017 2323    Hgb A1C Lab Results  Component Value Date   HGBA1C 5.4 07/28/2017            Assessment & Plan:   Preventative Health Maintenance:  She will get a flu shot at work She declines tetanus booster today She will get her pap smear next year Encouraged her to consume a balanced diet and exercise regimen Will check CBC, CMET, Lipid profile today  Fungal Rash:  Will check CMET, if liver enzymes, will send in Terbinafine x 4 weeks  RTC in 1 year, sooner if needed Nicki Reaper, NP

## 2017-07-28 NOTE — Patient Instructions (Signed)

## 2017-07-29 ENCOUNTER — Other Ambulatory Visit: Payer: Self-pay | Admitting: Internal Medicine

## 2017-07-29 ENCOUNTER — Encounter: Payer: Self-pay | Admitting: Internal Medicine

## 2017-07-29 DIAGNOSIS — F419 Anxiety disorder, unspecified: Secondary | ICD-10-CM | POA: Insufficient documentation

## 2017-07-29 DIAGNOSIS — F329 Major depressive disorder, single episode, unspecified: Secondary | ICD-10-CM

## 2017-07-29 DIAGNOSIS — J45909 Unspecified asthma, uncomplicated: Secondary | ICD-10-CM | POA: Insufficient documentation

## 2017-07-29 DIAGNOSIS — F32A Depression, unspecified: Secondary | ICD-10-CM

## 2017-07-29 DIAGNOSIS — G43109 Migraine with aura, not intractable, without status migrainosus: Secondary | ICD-10-CM | POA: Insufficient documentation

## 2017-07-29 LAB — COMPREHENSIVE METABOLIC PANEL
ALT: 58 U/L — AB (ref 0–35)
AST: 32 U/L (ref 0–37)
Albumin: 4.1 g/dL (ref 3.5–5.2)
Alkaline Phosphatase: 104 U/L (ref 39–117)
BILIRUBIN TOTAL: 0.3 mg/dL (ref 0.2–1.2)
BUN: 10 mg/dL (ref 6–23)
CALCIUM: 9.8 mg/dL (ref 8.4–10.5)
CHLORIDE: 102 meq/L (ref 96–112)
CO2: 25 meq/L (ref 19–32)
Creatinine, Ser: 0.7 mg/dL (ref 0.40–1.20)
GFR: 103.42 mL/min (ref >60.00)
GLUCOSE: 79 mg/dL (ref 70–99)
Potassium: 4 mEq/L (ref 3.5–5.1)
SODIUM: 135 meq/L (ref 135–145)
Total Protein: 7.5 g/dL (ref 6.0–8.3)

## 2017-07-29 LAB — LIPID PANEL
CHOL/HDL RATIO: 4
Cholesterol: 207 mg/dL — ABNORMAL HIGH (ref 0–200)
HDL: 51.9 mg/dL (ref >39.00)
LDL CALC: 115 mg/dL — AB (ref 0–99)
NONHDL: 155.31
TRIGLYCERIDES: 200 mg/dL — AB (ref 0.0–149.0)
VLDL: 40 mg/dL (ref 0.0–40.0)

## 2017-07-29 LAB — CBC
HCT: 37.5 % (ref 36.0–46.0)
Hemoglobin: 12.2 g/dL (ref 12.0–15.0)
MCHC: 32.5 g/dL (ref 30.0–36.0)
MCV: 80.6 fl (ref 78.0–100.0)
Platelets: 489 10*3/uL — ABNORMAL HIGH (ref 150.0–400.0)
RBC: 4.65 Mil/uL (ref 3.87–5.11)
RDW: 14.5 % (ref 11.5–15.5)
WBC: 9.2 10*3/uL (ref 4.0–10.5)

## 2017-07-29 LAB — HEMOGLOBIN A1C: Hgb A1c MFr Bld: 5.4 % (ref 4.6–6.5)

## 2017-07-29 MED ORDER — CLOTRIMAZOLE-BETAMETHASONE 1-0.05 % EX CREA: 1.0000 "application " | TOPICAL_CREAM | Freq: Two times a day (BID) | CUTANEOUS | 0 refills | Status: DC

## 2017-07-29 NOTE — Assessment & Plan Note (Signed)
I do not want her just taking Benzodiazepines just prn Will start Lexapro, eRx sent to pharmacy Continue Xanax prn, will follow UDS

## 2017-07-29 NOTE — Telephone Encounter (Signed)
I don't want this phoned in until we are sure she has picked up the Lexapro. Please call the pharmacy Monday to see if she picked it up.

## 2017-07-29 NOTE — Telephone Encounter (Signed)
Last filled 09/2016. Please advise.

## 2017-07-29 NOTE — Assessment & Plan Note (Signed)
Continue antihistamine and Albuterol as needed

## 2017-07-29 NOTE — Assessment & Plan Note (Signed)
eRx for Imitrex to take prn

## 2017-08-02 ENCOUNTER — Other Ambulatory Visit: Payer: Self-pay | Admitting: Internal Medicine

## 2017-08-02 DIAGNOSIS — F419 Anxiety disorder, unspecified: Principal | ICD-10-CM

## 2017-08-02 DIAGNOSIS — F32A Depression, unspecified: Secondary | ICD-10-CM

## 2017-08-02 DIAGNOSIS — F329 Major depressive disorder, single episode, unspecified: Secondary | ICD-10-CM

## 2017-08-02 NOTE — Telephone Encounter (Signed)
I called [facility] and they stated that she picked up Lexapro Rx.... Xanax called in as instructed

## 2017-08-03 ENCOUNTER — Other Ambulatory Visit: Payer: Self-pay | Admitting: Internal Medicine

## 2017-11-07 ENCOUNTER — Other Ambulatory Visit: Payer: Self-pay | Admitting: Internal Medicine

## 2017-11-07 DIAGNOSIS — F32A Depression, unspecified: Secondary | ICD-10-CM

## 2017-11-07 DIAGNOSIS — F329 Major depressive disorder, single episode, unspecified: Secondary | ICD-10-CM

## 2017-11-07 DIAGNOSIS — F419 Anxiety disorder, unspecified: Principal | ICD-10-CM

## 2017-11-07 MED ORDER — ALPRAZOLAM 0.25 MG PO TABS: 0.2500 mg | ORAL_TABLET | Freq: Every day | ORAL | 0 refills | Status: DC | PRN

## 2018-03-20 ENCOUNTER — Encounter: Payer: Self-pay | Admitting: Internal Medicine

## 2018-03-24 ENCOUNTER — Encounter: Payer: Self-pay | Admitting: Internal Medicine

## 2018-03-28 DIAGNOSIS — Z8349 Family history of other endocrine, nutritional and metabolic diseases: Secondary | ICD-10-CM | POA: Diagnosis not present

## 2018-03-28 DIAGNOSIS — N921 Excessive and frequent menstruation with irregular cycle: Secondary | ICD-10-CM | POA: Diagnosis not present

## 2018-04-05 ENCOUNTER — Encounter: Payer: Self-pay | Admitting: Internal Medicine

## 2018-04-05 MED ORDER — SUMATRIPTAN SUCCINATE 25 MG PO TABS: 25.0000 mg | ORAL_TABLET | ORAL | 0 refills | Status: DC | PRN

## 2018-04-05 NOTE — Telephone Encounter (Signed)
Last filled 07/2017... Last OV CPE 07/2017... Please advise

## 2018-04-10 ENCOUNTER — Other Ambulatory Visit: Payer: Self-pay | Admitting: Internal Medicine

## 2018-04-10 NOTE — Telephone Encounter (Signed)
[name] [address] and rx ready for pick up. Pt said she had spoken with 2 different people at [facility] [address] and was told imitrex was not sent in. Pt voiced understanding and will ck with pharmacy.

## 2018-04-11 ENCOUNTER — Telehealth: Payer: Self-pay

## 2018-04-11 DIAGNOSIS — Z124 Encounter for screening for malignant neoplasm of cervix: Secondary | ICD-10-CM | POA: Diagnosis not present

## 2018-04-11 DIAGNOSIS — R87612 Low grade squamous intraepithelial lesion on cytologic smear of cervix (LGSIL): Secondary | ICD-10-CM | POA: Diagnosis not present

## 2018-04-11 DIAGNOSIS — N921 Excessive and frequent menstruation with irregular cycle: Secondary | ICD-10-CM | POA: Diagnosis not present

## 2018-04-11 MED ORDER — SUMATRIPTAN SUCCINATE 25 MG PO TABS: 25.0000 mg | ORAL_TABLET | ORAL | 0 refills | Status: DC | PRN

## 2018-04-11 NOTE — Telephone Encounter (Signed)
I spoke with [name] at [facility] and cancelled the sumatriptan that was transferred from [facility] [address] and sent electronically the sumatriptan rx that was updated with max of 8 tabs daily. I also spoke with Harvie Heckandy at [facility] [address] to d/c sumatriptan rx that was sent to [facility] of [facility]. [name] voiced understanding and would d/c rx. [name] Johnston had notified pt rx was sent to pharmacy because pt was still waiting at pharmacy.

## 2018-04-11 NOTE — Telephone Encounter (Signed)
[name] from [facility] has transfer of [facility]riptan from [facility] [address] and Florentina AddisonKatie wants to add to the rx what is the max tabs pt can have in 24 hr period. [name] request cb. Pt was waiting.

## 2018-04-11 NOTE — Addendum Note (Signed)
Addended by: Roena MaladyEVONTENNO, Carole Doner Y on: 04/11/2018 03:35 PM   Modules accepted: Orders

## 2018-05-02 DIAGNOSIS — Z6841 Body Mass Index (BMI) 40.0 and over, adult: Secondary | ICD-10-CM | POA: Diagnosis not present

## 2018-05-02 DIAGNOSIS — N946 Dysmenorrhea, unspecified: Secondary | ICD-10-CM | POA: Diagnosis not present

## 2018-05-02 DIAGNOSIS — N939 Abnormal uterine and vaginal bleeding, unspecified: Secondary | ICD-10-CM | POA: Diagnosis not present

## 2018-05-25 ENCOUNTER — Ambulatory Visit (INDEPENDENT_AMBULATORY_CARE_PROVIDER_SITE_OTHER): Payer: BLUE CROSS/BLUE SHIELD | Admitting: Internal Medicine

## 2018-05-25 ENCOUNTER — Encounter: Payer: Self-pay | Admitting: Internal Medicine

## 2018-05-25 VITALS — BP 124/84 | HR 93 | Temp 98.7°F | Ht 66.0 in | Wt 267.0 lb

## 2018-05-25 DIAGNOSIS — F419 Anxiety disorder, unspecified: Secondary | ICD-10-CM | POA: Diagnosis not present

## 2018-05-25 DIAGNOSIS — J4520 Mild intermittent asthma, uncomplicated: Secondary | ICD-10-CM

## 2018-05-25 DIAGNOSIS — N921 Excessive and frequent menstruation with irregular cycle: Secondary | ICD-10-CM

## 2018-05-25 DIAGNOSIS — Z0000 Encounter for general adult medical examination without abnormal findings: Secondary | ICD-10-CM

## 2018-05-25 DIAGNOSIS — H00014 Hordeolum externum left upper eyelid: Secondary | ICD-10-CM | POA: Diagnosis not present

## 2018-05-25 DIAGNOSIS — G43109 Migraine with aura, not intractable, without status migrainosus: Secondary | ICD-10-CM

## 2018-05-25 NOTE — Progress Notes (Signed)
Subjective:    Patient ID: [name] Johnston, female    DOB: 1986/08/04, 32 y.o.   MRN: [id number]  HPI  Pt presents to the clinic today for her annual exam. She is also due to follow up chronic conditions.  Allergy Induced Asthma: Worse in the spring. She takes an antihistamine OTC and Albuterol as needed with good relief.  Migraines: Intermittent. Triggered by stress and light. Have not been as bad lately, since she changed jobs. She takes Imitrex as needed with good relief.  Anxiety: No longer taking Lexapro and Xanax.  She is having a uterine ablation August 7th by [facility]. She is currently taking Megace to keep her from having a period until her procedure.   Flu: 08/2017 Tetanus: unsure Pap Smear: 04/2018 Dentist: as needed  Diet: She does eat meat. She consumes fruits and veggies dally. She tries to avoid fried foods. She drinks mostly water. Exercise:  Walking  Review of Systems      Past Medical History:  Diagnosis Date  . Asthma    seasonal allergies  . Complication of anesthesia    nausea  . Obesity (BMI 30-39.9)   . Rash    Lt forearm circular    Current Outpatient Medications  Medication Sig Dispense Refill  . albuterol (PROAIR HFA) 108 (90 Base) MCG/ACT inhaler Inhale 2 puffs into the lungs every 4 (four) hours as needed for wheezing or shortness of breath. 1 Inhaler 2  . ALPRAZolam (XANAX) 0.25 MG tablet Take 1 tablet (0.25 mg total) by mouth daily as needed. for anxiety 20 tablet 0  . clotrimazole-betamethasone (LOTRISONE) cream Apply 1 application topically 2 (two) times daily. 30 g 0  . escitalopram (LEXAPRO) 10 MG tablet Take 1 tablet (10 mg total) by mouth at bedtime. 30 tablet 5  . SUMAtriptan (IMITREX) 25 MG tablet Take 1 tablet (25 mg total) by mouth every 2 (two) hours as needed for migraine. Max of 8 tabs daily 10 tablet 0   No current facility-administered medications for this visit.     Allergies  Allergen Reactions  . Other  Hives    Steri strips  . Tape Rash    Family History  Problem Relation Age of Onset  . Thyroid disease Mother   . Pancreatic cancer Mother   . Bladder Cancer Father   . Arthritis Maternal Grandmother   . Diabetes Maternal Grandmother   . Prostate cancer Maternal Grandfather   . Breast cancer Paternal Grandmother     Social History   Socioeconomic History  . Marital status: Single    Spouse name: Not on file  . Number of children: Not on file  . Years of education: Not on file  . Highest education level: Not on file  Occupational History  . Not on file  Social Needs  . Financial resource strain: Not on file  . Food insecurity:    Worry: Not on file    Inability: Not on file  . Transportation needs:    Medical: Not on file    Non-medical: Not on file  Tobacco Use  . Smoking status: Never Smoker  . Smokeless tobacco: Never Used  Substance and Sexual Activity  . Alcohol use: Yes    Comment: occasional  . Drug use: No  . Sexual activity: Yes  Lifestyle  . Physical activity:    Days per week: Not on file    Minutes per session: Not on file  . Stress: Not on file  Relationships  . Social connections:    Talks on phone: Not on file    Gets together: Not on file    Attends religious service: Not on file    Active member of club or organization: Not on file    Attends meetings of clubs or organizations: Not on file    Relationship status: Not on file  . Intimate partner violence:    Fear of current or ex partner: Not on file    Emotionally abused: Not on file    Physically abused: Not on file    Forced sexual activity: Not on file  Other Topics Concern  . Not on file  Social History Narrative  . Not on file     Constitutional: Denies fever, malaise, fatigue, headache or abrupt weight changes.  HEENT: Pt reports stye to left upper eyelid. Denies eye pain, eye redness, ear pain, ringing in the ears, wax buildup, runny nose, nasal congestion, bloody nose, or  sore throat. Respiratory: Denies difficulty breathing, shortness of breath, cough or sputum production.   Cardiovascular: Denies chest pain, chest tightness, palpitations or swelling in the hands or feet.  Gastrointestinal: Pt reports reflux. Denies abdominal pain, bloating, constipation, diarrhea or blood in the stool.  GU: Denies urgency, frequency, pain with urination, burning sensation, blood in urine, odor or discharge. Musculoskeletal: Denies decrease in range of motion, difficulty with gait, muscle pain or joint pain and swelling.  Skin: Denies redness, rashes, lesions or ulcercations.  Neurological: Denies dizziness, difficulty with memory, difficulty with speech or problems with balance and coordination.  Psych: Denies anxiety, depression, SI/HI.  No other specific complaints in a complete review of systems (except as listed in HPI above).  Objective:   Physical Exam   BP 124/84   Pulse 93   Temp 98.7 F (37.1 C) (Oral)   Ht 5\' 6"  (1.676 m)   Wt 267 lb (121.1 kg)   LMP 04/26/2018 Comment: irregular  SpO2 99%   BMI 43.09 kg/m  Wt Readings from Last 3 Encounters:  05/25/18 267 lb (121.1 kg)  07/28/17 259 lb (117.5 kg)  06/12/17 230 lb (104.3 kg)    General: Appears her stated age, obese in NAD. Skin: Warm, dry and intact.  HEENT: Head: normal shape and size; Eyes: sclera white, no icterus, conjunctiva pink, PERRLA and EOMs intac, stye noted of left upper eyelid; Ears: Tm's gray and intact, normal light reflex; Throat/Mouth: Teeth present, mucosa pink and moist, no exudate, lesions or ulcerations noted.  Neck:  Neck supple, trachea midline. No masses, lumps or thyromegaly present.  Cardiovascular: Normal rate and rhythm. S1,S2 noted.  No murmur, rubs or gallops noted. No JVD or BLE edema.  Pulmonary/Chest: Normal effort and positive vesicular breath sounds. No respiratory distress. No wheezes, rales or ronchi noted.  Abdomen: Soft and nontender. Normal bowel sounds. No  distention or masses noted. Liver, spleen and kidneys non palpable. Musculoskeletal: Strength 5/5 BUE/BLE. No difficulty with gait.  Neurological: Alert and oriented. Cranial nerves II-XII grossly intact. Coordination normal.  Psychiatric: Mood and affect normal. Behavior is normal. Judgment and thought content normal.     BMET    Component Value Date/Time   NA 135 07/28/2017 1647   NA 140 07/09/2012 0445   K 4.0 07/28/2017 1647   K 3.8 07/09/2012 0445   CL 102 07/28/2017 1647   CL 107 07/09/2012 0445   CO2 25 07/28/2017 1647   CO2 24 07/09/2012 0445   GLUCOSE 79 07/28/2017 1647  GLUCOSE 107 (H) 07/09/2012 0445   BUN 10 07/28/2017 1647   BUN 12 07/09/2012 0445   CREATININE 0.70 07/28/2017 1647   CREATININE 0.72 07/09/2012 0445   CALCIUM 9.8 07/28/2017 1647   CALCIUM 9.1 07/09/2012 0445   GFRNONAA >60 06/12/2017 2323   GFRNONAA >60 07/09/2012 0445   GFRAA >60 06/12/2017 2323   GFRAA >60 07/09/2012 0445    Lipid Panel     Component Value Date/Time   CHOL 207 (H) 07/28/2017 1647   TRIG 200.0 (H) 07/28/2017 1647   HDL 51.90 07/28/2017 1647   CHOLHDL 4 07/28/2017 1647   VLDL 40.0 07/28/2017 1647   LDLCALC 115 (H) 07/28/2017 1647    CBC    Component Value Date/Time   WBC 9.2 07/28/2017 1647   RBC 4.65 07/28/2017 1647   HGB 12.2 07/28/2017 1647   HGB 14.4 07/09/2012 0445   HCT 37.5 07/28/2017 1647   HCT 41.8 07/09/2012 0445   PLT 489.0 (H) 07/28/2017 1647   PLT 321 07/09/2012 0445   MCV 80.6 07/28/2017 1647   MCV 85 07/09/2012 0445   MCH 28.3 06/12/2017 2323   MCHC 32.5 07/28/2017 1647   RDW 14.5 07/28/2017 1647   RDW 13.3 07/09/2012 0445   LYMPHSABS 3.9 (H) 06/12/2017 2323   MONOABS 0.5 06/12/2017 2323   EOSABS 0.1 06/12/2017 2323   BASOSABS 0.1 06/12/2017 2323    Hgb A1C Lab Results  Component Value Date   HGBA1C 5.4 07/28/2017           Assessment & Plan:   Preventative Health Maintenance:  Encouraged her to get a flu shot in the fall She  declines tetanus booster today Pap smear UTD Encouraged her to consume a balanced diet and exercise regimen Advised her to see a dentist annually She declines labs, just had them done last month by GYN, will request copy  Stye, Left Eyelid:  Warm compresses TID Make appt with ophthalmology if persist or worsens, may need to be drained  Menorrhagia:  Scheduled for ablation RTC in 1 year, sooner if needed Nicki Reaperegina Alli Jasmer, NP

## 2018-05-25 NOTE — Assessment & Plan Note (Signed)
Avoid triggers Continue Imitrex prn

## 2018-05-25 NOTE — Patient Instructions (Signed)

## 2018-05-25 NOTE — Assessment & Plan Note (Signed)
Improved, now off meds Will monitor 

## 2018-05-25 NOTE — Assessment & Plan Note (Signed)
Continue antihistamine OTC Continue Albuterol prn

## 2018-05-31 DIAGNOSIS — Z01818 Encounter for other preprocedural examination: Secondary | ICD-10-CM | POA: Diagnosis not present

## 2018-05-31 DIAGNOSIS — N939 Abnormal uterine and vaginal bleeding, unspecified: Secondary | ICD-10-CM | POA: Diagnosis not present

## 2018-05-31 DIAGNOSIS — Z6841 Body Mass Index (BMI) 40.0 and over, adult: Secondary | ICD-10-CM | POA: Diagnosis not present

## 2018-06-07 DIAGNOSIS — N939 Abnormal uterine and vaginal bleeding, unspecified: Secondary | ICD-10-CM | POA: Diagnosis not present

## 2018-09-06 ENCOUNTER — Encounter: Payer: Self-pay | Admitting: Internal Medicine

## 2018-09-06 MED ORDER — ALBUTEROL SULFATE HFA 108 (90 BASE) MCG/ACT IN AERS: 2.0000 | INHALATION_SPRAY | RESPIRATORY_TRACT | 2 refills | Status: DC | PRN

## 2018-09-18 ENCOUNTER — Other Ambulatory Visit: Payer: Self-pay | Admitting: Internal Medicine

## 2018-09-18 NOTE — Telephone Encounter (Signed)
Refill Imitrex Last office visit 05/25/18 Last refill 04/11/18 #10

## 2018-09-19 MED ORDER — SUMATRIPTAN SUCCINATE 25 MG PO TABS: 25.0000 mg | ORAL_TABLET | ORAL | 2 refills | Status: DC | PRN

## 2019-02-13 ENCOUNTER — Encounter: Payer: Self-pay | Admitting: Internal Medicine

## 2019-02-13 ENCOUNTER — Ambulatory Visit (INDEPENDENT_AMBULATORY_CARE_PROVIDER_SITE_OTHER): Payer: BLUE CROSS/BLUE SHIELD | Admitting: Internal Medicine

## 2019-02-13 DIAGNOSIS — L509 Urticaria, unspecified: Secondary | ICD-10-CM

## 2019-02-13 DIAGNOSIS — G43109 Migraine with aura, not intractable, without status migrainosus: Secondary | ICD-10-CM

## 2019-02-13 MED ORDER — PREDNISONE 10 MG PO TABS: ORAL_TABLET | ORAL | 0 refills | Status: DC

## 2019-02-13 MED ORDER — SUMATRIPTAN SUCCINATE 25 MG PO TABS: 25.0000 mg | ORAL_TABLET | ORAL | 2 refills | Status: DC | PRN

## 2019-02-13 NOTE — Progress Notes (Signed)
Virtual Visit via Video Note  I connected with [name] Johnston on 02/13/19 at  4:15 PM EDT by a video enabled telemedicine application and verified that I am speaking with the correct person using two identifiers.   I discussed the limitations of evaluation and management by telemedicine and the availability of in person appointments. The patient expressed understanding and agreed to proceed.  Patient Location: Home Provider Location: Office  History of Present Illness:  Pt reports rash of neck and chest. She noticed this 1 month ago. She reports the rash is raised and itchy. It has not spread. She denies recent changes in diet or medications. She denies changes in soaps, lotions or detergents. No one in her home has a similar rash. She reports she has had rashes like this in the past during allergy season. She has tried Benadryl and Hydrocortisone with minimal relief.    She would also like to have her migraine medicine refilled. She reports her migraines occur every few weeks. The Imitrex works but can sometimes cause some jaw pain.  Observations/Objective:  Alert and oriented x 3 She sent a picture to my cell phone- hives noted of upper chest and neck No angioedema, difficulty breathing noted.  Assessment and Plan:  Hives:  Unknown origin Allergy vs stress RX for Pred Taper x 6 days Encouraged her to take Zyrtec OTC daily during allergy season Discussed stress relieving techniques such as distraction, deep breathing, meditation  Migraines:  Imitrex refilled today  Follow Up Instructions:    I discussed the assessment and treatment plan with the patient. The patient was provided an opportunity to ask questions and all were answered. The patient agreed with the plan and demonstrated an understanding of the instructions.   The patient was advised to call back or seek an in-person evaluation if the symptoms worsen or if the condition fails to improve as anticipated.   Nicki Reaper, NP

## 2019-02-13 NOTE — Patient Instructions (Signed)
Hives  Hives are itchy, red, swollen areas on your skin. Hives can show up on any part of your body. Hives often fade within 24 hours (acute hives). New hives can show up after old ones fade. This can go on for many days or weeks (chronic hives). Hives do not spread from person to person (are not contagious).  Hives are caused by your body's response to something that you are allergic to (allergen). These are sometimes called triggers. You can get hives right after being around a trigger, or hours later.  What are the causes?  · Allergies to foods.  · Insect bites or stings.  · Pollen.  · Pets.  · Latex.  · Chemicals.  · Spending time in sunlight, heat, or cold.  · Exercise.  · Stress.  · Some medicines.  · Viruses. This includes the common cold.  · Infections caused by germs (bacteria).  · Allergy shots.  · Blood transfusions.  Sometimes, the cause is not known.  What increases the risk?  · Being a woman.  · Being allergic to foods such as:  ? Citrus fruits.  ? Milk.  ? Eggs.  ? Peanuts.  ? Tree nuts.  ? Shellfish.  · Being allergic to:  ? Medicines.  ? Latex.  ? Insects.  ? Animals.  ? Pollen.  What are the signs or symptoms?    · Raised, itchy, red or white bumps or patches on your skin. These areas may:  ? Get large and swollen.  ? Change in shape and location.  ? Stand alone or connect to each other over a large area of skin.  ? Sting or hurt.  ? Turn white when pressed in the center (blanch).  In very bad cases, your hands, feet, and face may also get swollen. This may happen if hives start deeper in your skin.  How is this treated?  Treatment for this condition depends on your symptoms. Treatment may include:  · Using cool, wet cloths (cool compresses) or taking cool showers to stop the itching.  · Medicines that help:  ? Relieve itching (antihistamines).  ? Reduce swelling (corticosteroids).  ? Treat infection (antibiotics).  · A medicine (omalizumab) that is given as a shot (injection). Your doctor may  prescribe this if you have hives that do not get better even after other treatments.  · In very bad cases, you may need a shot of a medicine called epinephrineto prevent a life-threatening allergic reaction (anaphylaxis).  Follow these instructions at home:  Medicines  · Take or apply over-the-counter and prescription medicines only as told by your doctor.  · If you were prescribed an antibiotic medicine, use it as told by your doctor. Do not stop using it even if you start to feel better.  Skin care  · Apply cool, wet cloths to the hives.  · Do not scratch your skin. Do not rub your skin.  General instructions  · Do not take hot showers or baths. This can make itching worse.  · Do not wear tight clothes.  · Use sunscreen and wear clothes that cover your skin when you are outside.  · Avoid any triggers that cause your hives. Keep a journal to help track what causes your hives. Write down:  ? What medicines you take.  ? What you eat and drink.  ? What products you use on your skin.  · Keep all follow-up visits as told by your doctor. This is important.  Contact   a doctor if:  · Your symptoms are not better with medicine.  · Your joints hurt or are swollen.  Get help right away if:  · You have a fever.  · You have pain in your belly (abdomen).  · Your tongue or lips are swollen.  · Your eyelids are swollen.  · Your chest or throat feels tight.  · You have trouble breathing or swallowing.  These symptoms may be an emergency. Do not wait to see if the symptoms will go away. Get medical help right away. Call your local emergency services (911 in the [place] Do not drive yourself to the hospital.  Summary  · Hives are itchy, red, swollen areas on your skin.  · Treatment for this condition depends on your symptoms.  · Avoid things that cause your hives. Keep a journal to help track what causes your hives.  · Take and apply over-the-counter and prescription medicines only as told by your doctor.  · Keep all follow-up visits  as told by your doctor. This is important.  This information is not intended to replace advice given to you by your health care provider. Make sure you discuss any questions you have with your health care provider.  Document Released: 07/27/2008 Document Revised: 05/03/2018 Document Reviewed: 05/03/2018  Elsevier Interactive Patient Education © 2019 [employer]

## 2019-02-21 ENCOUNTER — Encounter: Payer: Self-pay | Admitting: Internal Medicine

## 2019-03-29 ENCOUNTER — Encounter: Payer: Self-pay | Admitting: Internal Medicine

## 2019-03-30 ENCOUNTER — Ambulatory Visit: Payer: BLUE CROSS/BLUE SHIELD | Admitting: Family Medicine

## 2019-04-12 IMAGING — US US TRANSVAGINAL NON-OB
1 series · 13 of 25 positions shown · non-contrast
Comparison: 01/21/2017

CLINICAL DATA: Heavy vaginal bleeding for 1 month

EXAM:
TRANSABDOMINAL AND TRANSVAGINAL ULTRASOUND OF PELVIS
DOPPLER ULTRASOUND OF OVARIES
TECHNIQUE: Both transabdominal and transvaginal ultrasound examinations of the
pelvis were performed. Transabdominal technique was performed for
global imaging of the pelvis including uterus, ovaries, adnexal
regions, and pelvic cul-de-sac.
It was necessary to proceed with endovaginal exam following the
transabdominal exam to visualize the ovaries. Color and duplex
Doppler ultrasound was utilized to evaluate blood flow to the
ovaries.

[Series 1: us transvaginal non-ob · 0.28mm/px · 13 of 83 slices shown]
[im 1/83]
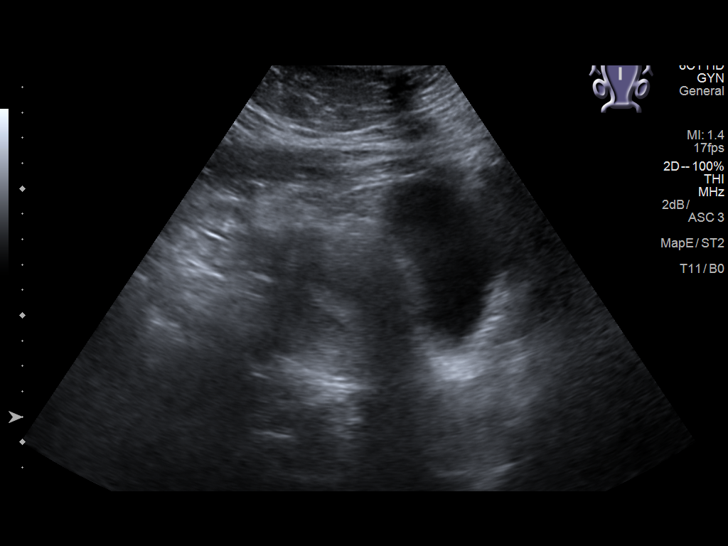
[im 7/83]
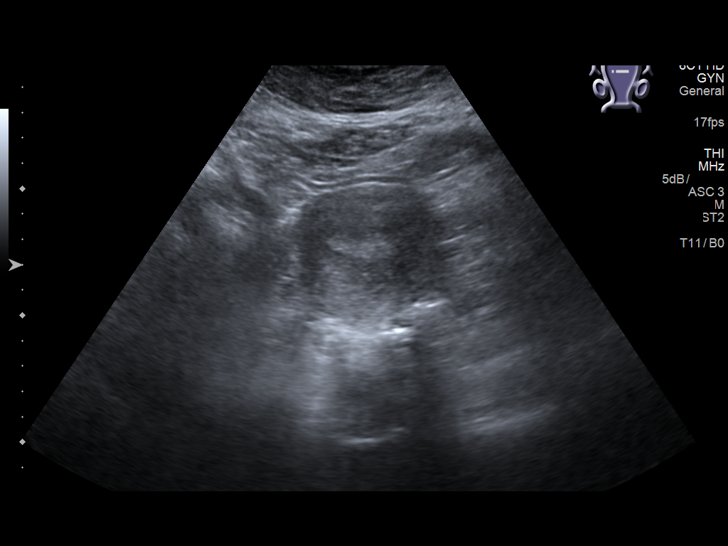
[im 14/83]
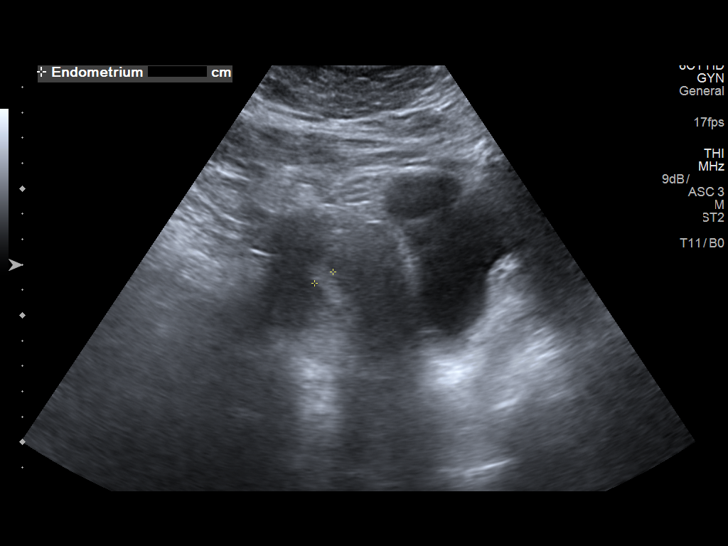
[im 21/83]
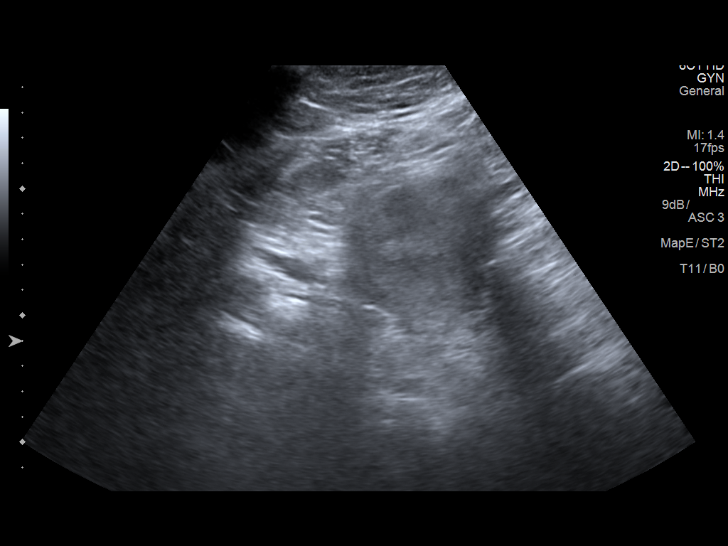
[im 28/83]
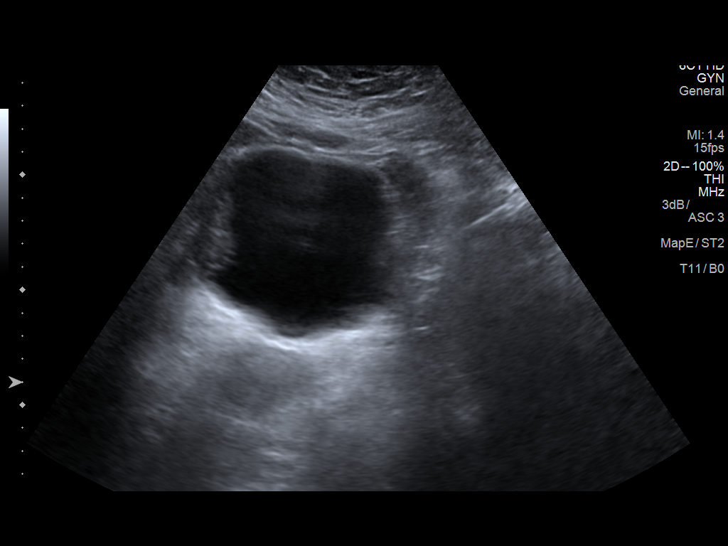
[im 35/83]
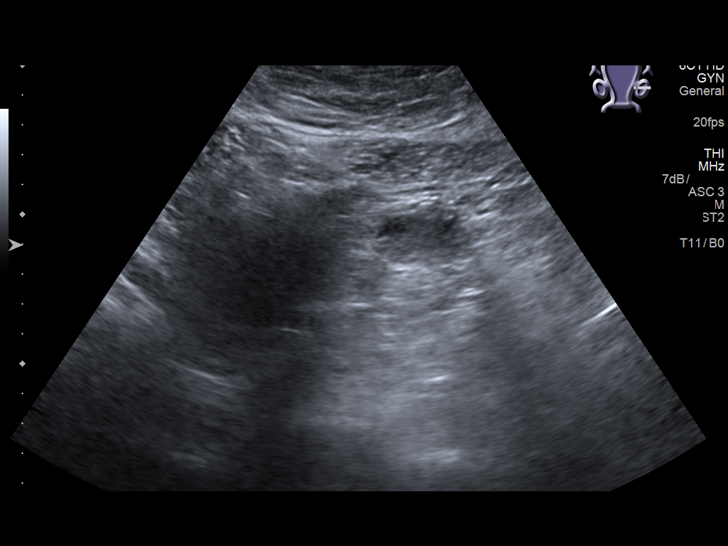
[im 42/83]
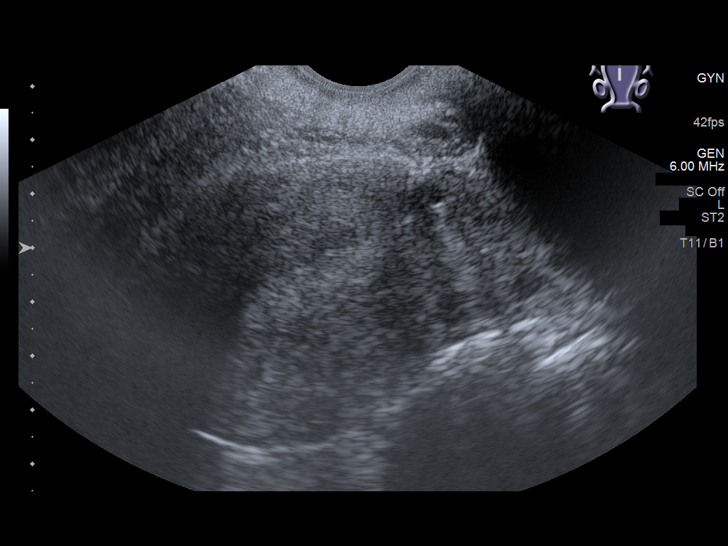
[im 48/83]
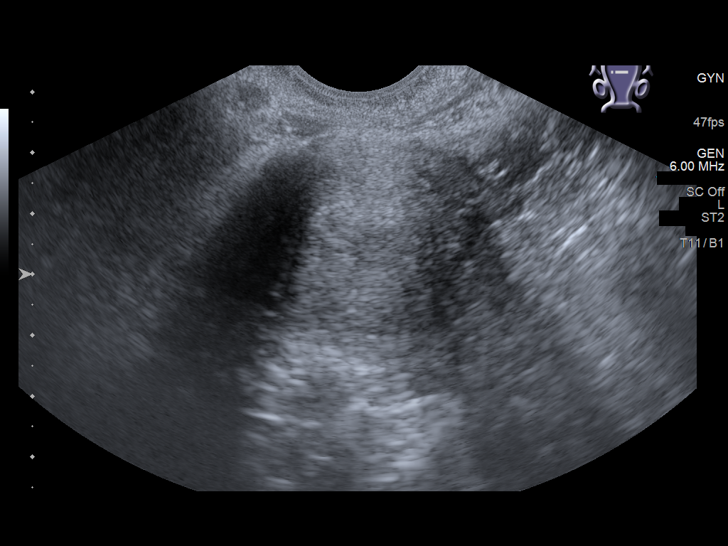
[im 55/83]
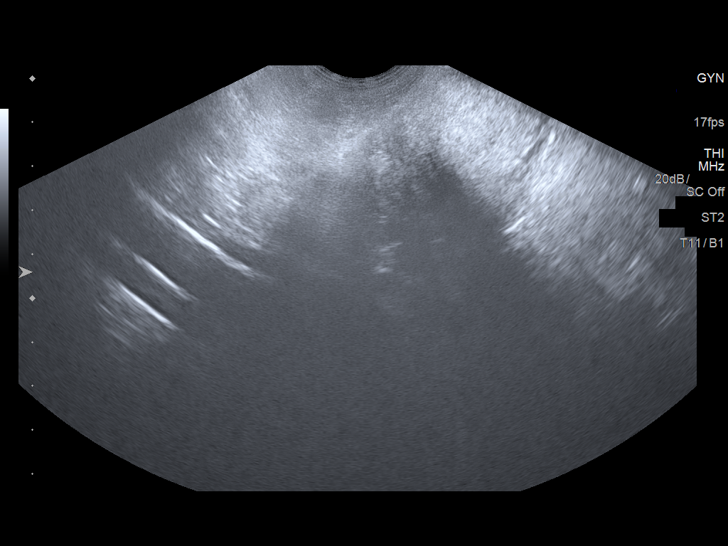
[im 62/83]
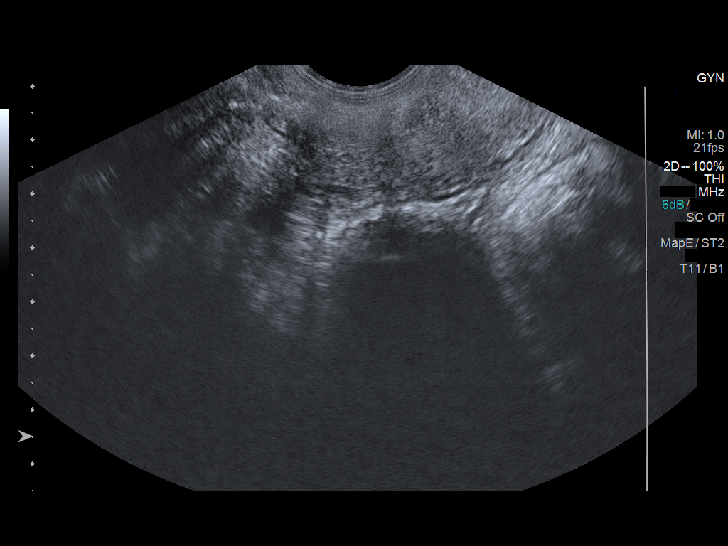
[im 69/83]
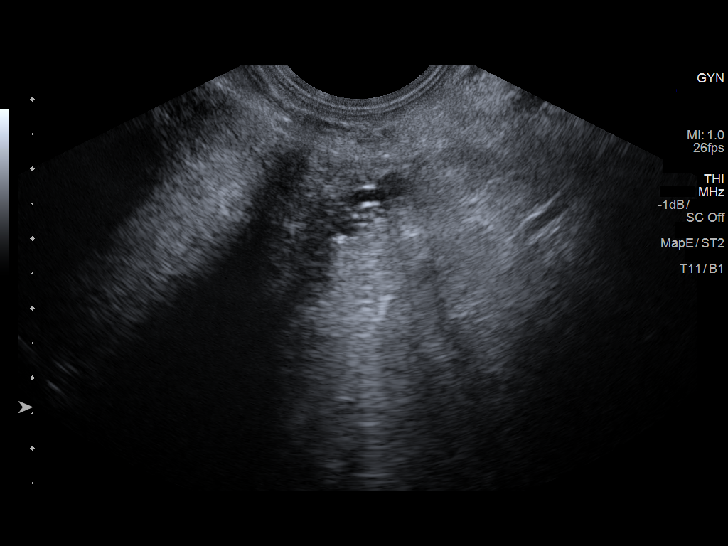
[im 76/83]
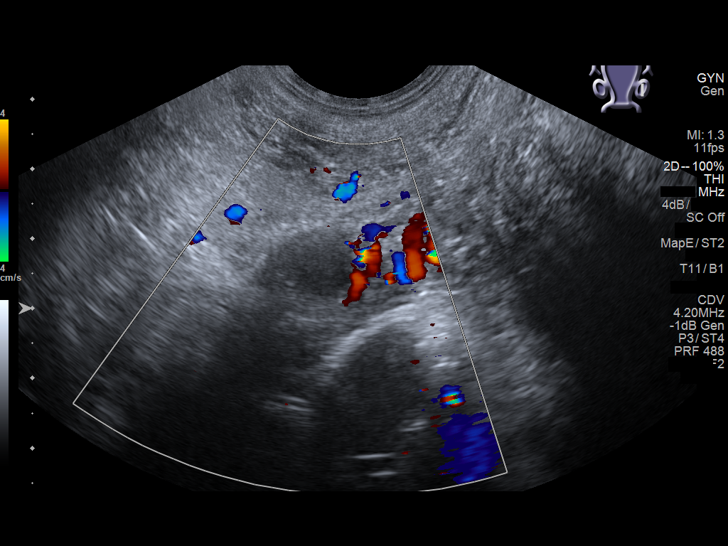
[im 83/83]
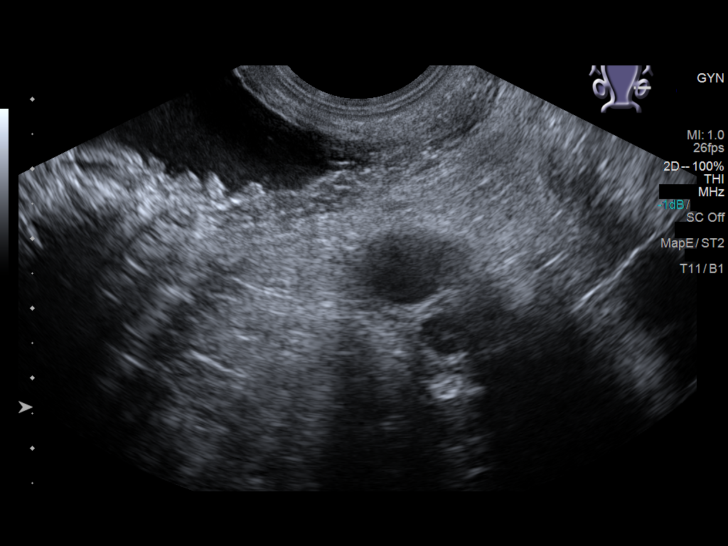

[13 of 25 positions shown; findings below may reference images not displayed]

FINDINGS: Uterus

Measurements: 11.5 x 5.5 x 5.7 cm. Multiple nabothian cysts in the
cervix.

Endometrium

Thickness: 10 mm.  No focal abnormality visualized.

Right ovary

Non visualized

Left ovary

Measurements: 2.7 x 1.7 x 2.2 cm. Normal appearance/no adnexal mass.

Pulsed Doppler evaluation of both ovaries demonstrates normal
low-resistance arterial and venous waveforms.

Other findings

Small free fluid in the pelvis.
IMPRESSION: 1. Negative for left ovarian torsion.  Nonvisualized right ovary.
2. Endometrial thickness is 10 mm. If bleeding remains unresponsive
to hormonal or medical therapy, sonohysterogram should be considered
for focal lesion work-up. (Ref: Radiological Reasoning: Algorithmic
Workup of Abnormal Vaginal Bleeding with Endovaginal Sonography and
Sonohysterography. AJR [id number]; [id number])
3. Small amount of free fluid in the pelvis

## 2019-05-29 ENCOUNTER — Encounter: Payer: Self-pay | Admitting: Internal Medicine

## 2019-06-12 DIAGNOSIS — J310 Chronic rhinitis: Secondary | ICD-10-CM | POA: Diagnosis not present

## 2019-06-12 DIAGNOSIS — T781XXD Other adverse food reactions, not elsewhere classified, subsequent encounter: Secondary | ICD-10-CM | POA: Diagnosis not present

## 2019-06-12 DIAGNOSIS — L508 Other urticaria: Secondary | ICD-10-CM | POA: Diagnosis not present

## 2019-06-12 DIAGNOSIS — J4520 Mild intermittent asthma, uncomplicated: Secondary | ICD-10-CM | POA: Diagnosis not present

## 2019-06-20 DIAGNOSIS — T781XXD Other adverse food reactions, not elsewhere classified, subsequent encounter: Secondary | ICD-10-CM | POA: Diagnosis not present

## 2019-06-20 DIAGNOSIS — L508 Other urticaria: Secondary | ICD-10-CM | POA: Diagnosis not present

## 2019-07-20 DIAGNOSIS — Z23 Encounter for immunization: Secondary | ICD-10-CM | POA: Diagnosis not present

## 2019-09-04 ENCOUNTER — Encounter: Payer: Self-pay | Admitting: Internal Medicine

## 2019-09-04 ENCOUNTER — Telehealth: Payer: Self-pay

## 2019-09-04 DIAGNOSIS — R6889 Other general symptoms and signs: Secondary | ICD-10-CM | POA: Diagnosis not present

## 2019-09-04 DIAGNOSIS — Z03818 Encounter for observation for suspected exposure to other biological agents ruled out: Secondary | ICD-10-CM | POA: Diagnosis not present

## 2019-09-04 DIAGNOSIS — J029 Acute pharyngitis, unspecified: Secondary | ICD-10-CM | POA: Diagnosis not present

## 2019-09-04 DIAGNOSIS — J039 Acute tonsillitis, unspecified: Secondary | ICD-10-CM | POA: Diagnosis not present

## 2019-09-04 NOTE — Telephone Encounter (Signed)
Error

## 2019-10-10 ENCOUNTER — Encounter: Payer: Self-pay | Admitting: Internal Medicine

## 2019-10-11 ENCOUNTER — Ambulatory Visit (INDEPENDENT_AMBULATORY_CARE_PROVIDER_SITE_OTHER): Payer: Self-pay | Admitting: Internal Medicine

## 2019-10-11 ENCOUNTER — Ambulatory Visit: Payer: BLUE CROSS/BLUE SHIELD | Admitting: Internal Medicine

## 2019-10-11 ENCOUNTER — Encounter: Payer: Self-pay | Admitting: Internal Medicine

## 2019-10-11 VITALS — BP 128/83

## 2019-10-11 DIAGNOSIS — G43109 Migraine with aura, not intractable, without status migrainosus: Secondary | ICD-10-CM

## 2019-10-11 MED ORDER — SUMATRIPTAN SUCCINATE 50 MG PO TABS: 50.0000 mg | ORAL_TABLET | ORAL | 5 refills | Status: DC | PRN

## 2019-10-11 NOTE — Progress Notes (Signed)
Virtual Visit via Video Note  I connected with [name] Johnston on 10/11/19 at 11:15 AM EST by a video enabled telemedicine application and verified that I am speaking with the correct person using two identifiers.  Location: Patient: Work Provider: Office   I discussed the limitations of evaluation and management by telemedicine and the availability of in person appointments. The patient expressed understanding and agreed to proceed.  History of Present Illness  Patient wanting to discuss her migraines.  She feels like she is having more migraines than usual.  Not only are they occurring more often but they are more intense.  They are typically located on the right side of her head.  She describes the pain as throbbing.  She does have some visual changes.  She is not sure what is triggering this, stress or maybe the weather.  She used to be able to take 1 Imitrex with good relief but now she is taking 2-3 tabs at a time with some relief.  She denies neck pain or neck injury.  She is not currently following with neurology.  Past Medical History:  Diagnosis Date  . Asthma    seasonal allergies  . Complication of anesthesia    nausea  . Obesity (BMI 30-39.9)   . Rash    Lt forearm circular    Current Outpatient Medications  Medication Sig Dispense Refill  . albuterol (PROAIR HFA) 108 (90 Base) MCG/ACT inhaler Inhale 2 puffs into the lungs every 4 (four) hours as needed for wheezing or shortness of breath. 1 Inhaler 2  . SUMAtriptan (IMITREX) 25 MG tablet Take 1 tablet (25 mg total) by mouth every 2 (two) hours as needed for migraine. Max of 8 tabs daily 10 tablet 2   No current facility-administered medications for this visit.    Allergies  Allergen Reactions  . Other Hives    Steri strips  . Tape Rash    Family History  Problem Relation Age of Onset  . Thyroid disease Mother   . Pancreatic cancer Mother   . Bladder Cancer Father   . Arthritis Maternal Grandmother   .  Diabetes Maternal Grandmother   . Prostate cancer Maternal Grandfather   . Breast cancer Paternal Grandmother     Social History   Socioeconomic History  . Marital status: Single    Spouse name: Not on file  . Number of children: Not on file  . Years of education: Not on file  . Highest education level: Not on file  Occupational History  . Not on file  Tobacco Use  . Smoking status: Never Smoker  . Smokeless tobacco: Never Used  Substance and Sexual Activity  . Alcohol use: Yes    Comment: occasional  . Drug use: No  . Sexual activity: Yes  Other Topics Concern  . Not on file  Social History Narrative  . Not on file   Social Determinants of Health   Financial Resource Strain:   . Difficulty of Paying Living Expenses: Not on file  Food Insecurity:   . Worried About Programme researcher, broadcasting/film/video in the Last Year: Not on file  . Ran Out of Food in the Last Year: Not on file  Transportation Needs:   . Lack of Transportation (Medical): Not on file  . Lack of Transportation (Non-Medical): Not on file  Physical Activity:   . Days of Exercise per Week: Not on file  . Minutes of Exercise per Session: Not on file  Stress:   .  Feeling of Stress : Not on file  Social Connections:   . Frequency of Communication with Friends and Family: Not on file  . Frequency of Social Gatherings with Friends and Family: Not on file  . Attends Religious Services: Not on file  . Active Member of Clubs or Organizations: Not on file  . Attends Archivist Meetings: Not on file  . Marital Status: Not on file  Intimate Partner Violence:   . Fear of Current or Ex-Partner: Not on file  . Emotionally Abused: Not on file  . Physically Abused: Not on file  . Sexually Abused: Not on file     Constitutional: Patient reports headaches.  Denies fever, malaise, fatigue, or abrupt weight changes.  HEENT: Patient reports visual changes.  Denies eye pain, eye redness, ear pain, ringing in the ears, wax  buildup, runny nose, nasal congestion, bloody nose, or sore throat. Respiratory: Denies difficulty breathing, shortness of breath, cough or sputum production.   Cardiovascular: Denies chest pain, chest tightness, palpitations or swelling in the hands or feet.  Musculoskeletal: Denies neck pain..  Skin: Denies redness, rashes, lesions or ulcercations.  Neurological: Denies dizziness, difficulty with memory, difficulty with speech or problems with balance and coordination.    No other specific complaints in a complete review of systems (except as listed in HPI above).  Observations/Objective:  BP 128/83   Wt Readings from Last 3 Encounters:  05/25/18 267 lb (121.1 kg)  07/28/17 259 lb (117.5 kg)  06/12/17 230 lb (104.3 kg)    General: Appears her stated age, obese in NAD. HEENT: Head: normal shape and size. Pulmonary/Chest: Normal effort. No respiratory distress.  Neurological: Alert and oriented.   BMET    Component Value Date/Time   NA 135 07/28/2017 1647   NA 140 07/09/2012 0445   K 4.0 07/28/2017 1647   K 3.8 07/09/2012 0445   CL 102 07/28/2017 1647   CL 107 07/09/2012 0445   CO2 25 07/28/2017 1647   CO2 24 07/09/2012 0445   GLUCOSE 79 07/28/2017 1647   GLUCOSE 107 (H) 07/09/2012 0445   BUN 10 07/28/2017 1647   BUN 12 07/09/2012 0445   CREATININE 0.70 07/28/2017 1647   CREATININE 0.72 07/09/2012 0445   CALCIUM 9.8 07/28/2017 1647   CALCIUM 9.1 07/09/2012 0445   GFRNONAA >60 06/12/2017 2323   GFRNONAA >60 07/09/2012 0445   GFRAA >60 06/12/2017 2323   GFRAA >60 07/09/2012 0445    Lipid Panel     Component Value Date/Time   CHOL 207 (H) 07/28/2017 1647   TRIG 200.0 (H) 07/28/2017 1647   HDL 51.90 07/28/2017 1647   CHOLHDL 4 07/28/2017 1647   VLDL 40.0 07/28/2017 1647   LDLCALC 115 (H) 07/28/2017 1647    CBC    Component Value Date/Time   WBC 9.2 07/28/2017 1647   RBC 4.65 07/28/2017 1647   HGB 12.2 07/28/2017 1647   HGB 14.4 07/09/2012 0445   HCT  37.5 07/28/2017 1647   HCT 41.8 07/09/2012 0445   PLT 489.0 (H) 07/28/2017 1647   PLT 321 07/09/2012 0445   MCV 80.6 07/28/2017 1647   MCV 85 07/09/2012 0445   MCH 28.3 06/12/2017 2323   MCHC 32.5 07/28/2017 1647   RDW 14.5 07/28/2017 1647   RDW 13.3 07/09/2012 0445   LYMPHSABS 3.9 (H) 06/12/2017 2323   MONOABS 0.5 06/12/2017 2323   EOSABS 0.1 06/12/2017 2323   BASOSABS 0.1 06/12/2017 2323    Hgb A1C Lab Results  Component  Value Date   HGBA1C 5.4 07/28/2017       Assessment and Plan:  Migraines:  Will have her check her BP and let me know via mychart if elevated If BP elevated, will start Propanolol If BP normal, will trial Topamax 25 mg QHS- sedation caution given Increase Imitrex to 50 mg PRN  Update me in 1 month and let me know how your headaches are doing  Follow Up Instructions:    I discussed the assessment and treatment plan with the patient. The patient was provided an opportunity to ask questions and all were answered. The patient agreed with the plan and demonstrated an understanding of the instructions.   The patient was advised to call back or seek an in-person evaluation if the symptoms worsen or if the condition fails to improve as anticipated.     Nicki Reaperegina Thom Ollinger, NP

## 2019-10-12 MED ORDER — TOPIRAMATE 25 MG PO TABS: 25.0000 mg | ORAL_TABLET | Freq: Every evening | ORAL | 2 refills | Status: DC | PRN

## 2019-10-13 ENCOUNTER — Encounter: Payer: Self-pay | Admitting: Internal Medicine

## 2019-10-13 NOTE — Patient Instructions (Signed)

## 2019-12-31 ENCOUNTER — Encounter: Payer: Self-pay | Admitting: Internal Medicine

## 2020-01-01 ENCOUNTER — Encounter: Payer: Medicaid Other | Admitting: Internal Medicine

## 2020-02-13 ENCOUNTER — Encounter: Payer: Medicaid Other | Admitting: Internal Medicine

## 2020-02-13 NOTE — Progress Notes (Deleted)
Subjective:    Patient ID: [name] Johnston, female    DOB: 1986-02-02, 34 y.o.   MRN: [id number]  HPI  Pt presents to the clinic today for her annual exam. She is also due to follow up chronic conditions.  Ocular Migraine: These occur. Managed on Topamax and Imitrex. She is not following with oneirology.  Allergy Induced Asthma: She uses Albuterol as needed with good relief. There are no PFT's on file.   Anxiety: Situation. She is not taking any medication for this. She does not see a therapist. She denies SI/HI.  Flu: > 2 years ago Tetanus: Pap Smear: 11/2014 Dentist:  Diet: Exercise:  Review of Systems      Past Medical History:  Diagnosis Date  . Asthma    seasonal allergies  . Complication of anesthesia    nausea  . Obesity (BMI 30-39.9)   . Rash    Lt forearm circular    Current Outpatient Medications  Medication Sig Dispense Refill  . albuterol (PROAIR HFA) 108 (90 Base) MCG/ACT inhaler Inhale 2 puffs into the lungs every 4 (four) hours as needed for wheezing or shortness of breath. 1 Inhaler 2  . SUMAtriptan (IMITREX) 50 MG tablet Take 1 tablet (50 mg total) by mouth every 2 (two) hours as needed for migraine. May repeat in 2 hours if headache persists or recurs. 10 tablet 5  . topiramate (TOPAMAX) 25 MG tablet Take 1 tablet (25 mg total) by mouth at bedtime as needed. 30 tablet 2   No current facility-administered medications for this visit.    Allergies  Allergen Reactions  . Other Hives    Steri strips  . Tape Rash    Family History  Problem Relation Age of Onset  . Thyroid disease Mother   . Pancreatic cancer Mother   . Bladder Cancer Father   . Arthritis Maternal Grandmother   . Diabetes Maternal Grandmother   . Prostate cancer Maternal Grandfather   . Breast cancer Paternal Grandmother     Social History   Socioeconomic History  . Marital status: Single    Spouse name: Not on file  . Number of children: Not on file  . Years of  education: Not on file  . Highest education level: Not on file  Occupational History  . Not on file  Tobacco Use  . Smoking status: Never Smoker  . Smokeless tobacco: Never Used  Substance and Sexual Activity  . Alcohol use: Yes    Comment: occasional  . Drug use: No  . Sexual activity: Yes  Other Topics Concern  . Not on file  Social History Narrative  . Not on file   Social Determinants of Health   Financial Resource Strain:   . Difficulty of Paying Living Expenses:   Food Insecurity:   . Worried About Charity fundraiser in the Last Year:   . Arboriculturist in the Last Year:   Transportation Needs:   . Film/video editor (Medical):   Marland Kitchen Lack of Transportation (Non-Medical):   Physical Activity:   . Days of Exercise per Week:   . Minutes of Exercise per Session:   Stress:   . Feeling of Stress :   Social Connections:   . Frequency of Communication with Friends and Family:   . Frequency of Social Gatherings with Friends and Family:   . Attends Religious Services:   . Active Member of Clubs or Organizations:   . Attends Archivist Meetings:   .  Marital Status:   Intimate Partner Violence:   . Fear of Current or Ex-Partner:   . Emotionally Abused:   Marland Kitchen Physically Abused:   . Sexually Abused:      Constitutional: Denies fever, malaise, fatigue, headache or abrupt weight changes.  HEENT: Denies eye pain, eye redness, ear pain, ringing in the ears, wax buildup, runny nose, nasal congestion, bloody nose, or sore throat. Respiratory: Denies difficulty breathing, shortness of breath, cough or sputum production.   Cardiovascular: Denies chest pain, chest tightness, palpitations or swelling in the hands or feet.  Gastrointestinal: Denies abdominal pain, bloating, constipation, diarrhea or blood in the stool.  GU: Denies urgency, frequency, pain with urination, burning sensation, blood in urine, odor or discharge. Musculoskeletal: Denies decrease in range of  motion, difficulty with gait, muscle pain or joint pain and swelling.  Skin: Denies redness, rashes, lesions or ulcercations.  Neurological: Denies dizziness, difficulty with memory, difficulty with speech or problems with balance and coordination.  Psych: Denies anxiety, depression, SI/HI.  No other specific complaints in a complete review of systems (except as listed in HPI above).  Objective:   Physical Exam   There were no vitals taken for this visit. Wt Readings from Last 3 Encounters:  05/25/18 267 lb (121.1 kg)  07/28/17 259 lb (117.5 kg)  06/12/17 230 lb (104.3 kg)    General: Appears their stated age, well developed, well nourished in NAD. Skin: Warm, dry and intact. No rashes, lesions or ulcerations noted. HEENT: Head: normal shape and size; Eyes: sclera white, no icterus, conjunctiva pink, PERRLA and EOMs intact; Ears: Tm's gray and intact, normal light reflex; Nose: mucosa pink and moist, septum midline; Throat/Mouth: Teeth present, mucosa pink and moist, no exudate, lesions or ulcerations noted.  Neck:  Neck supple, trachea midline. No masses, lumps or thyromegaly present.  Cardiovascular: Normal rate and rhythm. S1,S2 noted.  No murmur, rubs or gallops noted. No JVD or BLE edema. No carotid bruits noted. Pulmonary/Chest: Normal effort and positive vesicular breath sounds. No respiratory distress. No wheezes, rales or ronchi noted.  Abdomen: Soft and nontender. Normal bowel sounds. No distention or masses noted. Liver, spleen and kidneys non palpable. Musculoskeletal: Normal range of motion. No signs of joint swelling. No difficulty with gait.  Neurological: Alert and oriented. Cranial nerves II-XII grossly intact. Coordination normal.  Psychiatric: Mood and affect normal. Behavior is normal. Judgment and thought content normal.   EKG:  BMET    Component Value Date/Time   NA 135 07/28/2017 1647   NA 140 07/09/2012 0445   K 4.0 07/28/2017 1647   K 3.8 07/09/2012 0445     CL 102 07/28/2017 1647   CL 107 07/09/2012 0445   CO2 25 07/28/2017 1647   CO2 24 07/09/2012 0445   GLUCOSE 79 07/28/2017 1647   GLUCOSE 107 (H) 07/09/2012 0445   BUN 10 07/28/2017 1647   BUN 12 07/09/2012 0445   CREATININE 0.70 07/28/2017 1647   CREATININE 0.72 07/09/2012 0445   CALCIUM 9.8 07/28/2017 1647   CALCIUM 9.1 07/09/2012 0445   GFRNONAA >60 06/12/2017 2323   GFRNONAA >60 07/09/2012 0445   GFRAA >60 06/12/2017 2323   GFRAA >60 07/09/2012 0445    Lipid Panel     Component Value Date/Time   CHOL 207 (H) 07/28/2017 1647   TRIG 200.0 (H) 07/28/2017 1647   HDL 51.90 07/28/2017 1647   CHOLHDL 4 07/28/2017 1647   VLDL 40.0 07/28/2017 1647   LDLCALC 115 (H) 07/28/2017 1647  CBC    Component Value Date/Time   WBC 9.2 07/28/2017 1647   RBC 4.65 07/28/2017 1647   HGB 12.2 07/28/2017 1647   HGB 14.4 07/09/2012 0445   HCT 37.5 07/28/2017 1647   HCT 41.8 07/09/2012 0445   PLT 489.0 (H) 07/28/2017 1647   PLT 321 07/09/2012 0445   MCV 80.6 07/28/2017 1647   MCV 85 07/09/2012 0445   MCH 28.3 06/12/2017 2323   MCHC 32.5 07/28/2017 1647   RDW 14.5 07/28/2017 1647   RDW 13.3 07/09/2012 0445   LYMPHSABS 3.9 (H) 06/12/2017 2323   MONOABS 0.5 06/12/2017 2323   EOSABS 0.1 06/12/2017 2323   BASOSABS 0.1 06/12/2017 2323    Hgb A1C Lab Results  Component Value Date   HGBA1C 5.4 07/28/2017           Assessment & Plan:    Nicki Reaper, NP This visit occurred during the SARS-CoV-2 public health emergency.  Safety protocols were in place, including screening questions prior to the visit, additional usage of staff PPE, and extensive cleaning of exam room while observing appropriate contact time as indicated for disinfecting solutions.

## 2020-03-17 DIAGNOSIS — J4541 Moderate persistent asthma with (acute) exacerbation: Secondary | ICD-10-CM | POA: Diagnosis not present

## 2020-03-17 DIAGNOSIS — J22 Unspecified acute lower respiratory infection: Secondary | ICD-10-CM | POA: Diagnosis not present

## 2020-04-01 DIAGNOSIS — J4541 Moderate persistent asthma with (acute) exacerbation: Secondary | ICD-10-CM | POA: Diagnosis not present

## 2020-04-03 ENCOUNTER — Encounter: Payer: Self-pay | Admitting: Internal Medicine

## 2020-04-03 MED ORDER — ALBUTEROL SULFATE HFA 108 (90 BASE) MCG/ACT IN AERS: 2.0000 | INHALATION_SPRAY | RESPIRATORY_TRACT | 0 refills | Status: AC | PRN

## 2020-06-11 DIAGNOSIS — M79672 Pain in left foot: Secondary | ICD-10-CM | POA: Diagnosis not present

## 2020-08-14 DIAGNOSIS — Z23 Encounter for immunization: Secondary | ICD-10-CM | POA: Diagnosis not present

## 2020-09-01 ENCOUNTER — Encounter: Payer: Self-pay | Admitting: Internal Medicine

## 2020-09-02 ENCOUNTER — Other Ambulatory Visit: Payer: Self-pay | Admitting: Internal Medicine

## 2020-09-02 MED ORDER — SUMATRIPTAN SUCCINATE 50 MG PO TABS: 50.0000 mg | ORAL_TABLET | ORAL | 5 refills | Status: AC | PRN

## 2020-11-06 ENCOUNTER — Encounter: Payer: Medicaid Other | Admitting: Internal Medicine

## 2020-11-06 NOTE — Progress Notes (Deleted)
Subjective:    Patient ID: [name] Johnston, female    DOB: August 11, 1986, 35 y.o.   MRN: [id number]  HPI  Patient presents to clinic today for her annual exam.  She is also due to follow-up chronic conditions.  Ocular migraines: These occur.  Managed on Topamax and Imitrex as needed for breakthrough.  She does not follow-up with neurology.  Anxiety: Situational.  She is not currently taking any medication for this.  She is not seeing a therapist.  She denies depression, SI/HI.  Flu: Tetanus: COVID: Pap smear: 11/2014 Dentist:  Diet: Exercise:  Review of Systems      Past Medical History:  Diagnosis Date  . Asthma    seasonal allergies  . Complication of anesthesia    nausea  . Obesity (BMI 30-39.9)   . Rash    Lt forearm circular    Current Outpatient Medications  Medication Sig Dispense Refill  . albuterol (PROAIR HFA) 108 (90 Base) MCG/ACT inhaler Inhale 2 puffs into the lungs every 4 (four) hours as needed for wheezing or shortness of breath. 18 g 0  . SUMAtriptan (IMITREX) 50 MG tablet Take 1 tablet (50 mg total) by mouth every 2 (two) hours as needed for migraine. May repeat in 2 hours if headache persists or recurs. 10 tablet 5  . topiramate (TOPAMAX) 25 MG tablet TAKE 1 TABLET BY MOUTH AT BEDTIME AS NEEDED 30 tablet 1   No current facility-administered medications for this visit.    Allergies  Allergen Reactions  . Other Hives    Steri strips  . Tape Rash    Family History  Problem Relation Age of Onset  . Thyroid disease Mother   . Pancreatic cancer Mother   . Bladder Cancer Father   . Arthritis Maternal Grandmother   . Diabetes Maternal Grandmother   . Prostate cancer Maternal Grandfather   . Breast cancer Paternal Grandmother     Social History   Socioeconomic History  . Marital status: Single    Spouse name: Not on file  . Number of children: Not on file  . Years of education: Not on file  . Highest education level: Not on file   Occupational History  . Not on file  Tobacco Use  . Smoking status: Never Smoker  . Smokeless tobacco: Never Used  Substance and Sexual Activity  . Alcohol use: Yes    Comment: occasional  . Drug use: No  . Sexual activity: Yes  Other Topics Concern  . Not on file  Social History Narrative  . Not on file   Social Determinants of Health   Financial Resource Strain: Not on file  Food Insecurity: Not on file  Transportation Needs: Not on file  Physical Activity: Not on file  Stress: Not on file  Social Connections: Not on file  Intimate Partner Violence: Not on file     Constitutional: Patient reports intermittent headaches.  Denies fever, malaise, fatigue, or abrupt weight changes.  HEENT: Denies eye pain, eye redness, ear pain, ringing in the ears, wax buildup, runny nose, nasal congestion, bloody nose, or sore throat. Respiratory: Denies difficulty breathing, shortness of breath, cough or sputum production.   Cardiovascular: Denies chest pain, chest tightness, palpitations or swelling in the hands or feet.  Gastrointestinal: Denies abdominal pain, bloating, constipation, diarrhea or blood in the stool.  GU: Denies urgency, frequency, pain with urination, burning sensation, blood in urine, odor or discharge. Musculoskeletal: Denies decrease in range of motion, difficulty with gait,  muscle pain or joint pain and swelling.  Skin: Denies redness, rashes, lesions or ulcercations.  Neurological: Denies dizziness, difficulty with memory, difficulty with speech or problems with balance and coordination.  Psych: Patient reports intermittent anxiety.  Denies depression, SI/HI.  No other specific complaints in a complete review of systems (except as listed in HPI above).  Objective:   Physical Exam   There were no vitals taken for this visit. Wt Readings from Last 3 Encounters:  05/25/18 267 lb (121.1 kg)  07/28/17 259 lb (117.5 kg)  06/12/17 230 lb (104.3 kg)    General:  Appears their stated age, well developed, well nourished in NAD. Skin: Warm, dry and intact. No rashes, lesions or ulcerations noted. HEENT: Head: normal shape and size; Eyes: sclera white, no icterus, conjunctiva pink, PERRLA and EOMs intact; Ears: Tm's gray and intact, normal light reflex; Nose: mucosa pink and moist, septum midline; Throat/Mouth: Teeth present, mucosa pink and moist, no exudate, lesions or ulcerations noted.  Neck:  Neck supple, trachea midline. No masses, lumps or thyromegaly present.  Cardiovascular: Normal rate and rhythm. S1,S2 noted.  No murmur, rubs or gallops noted. No JVD or BLE edema. No carotid bruits noted. Pulmonary/Chest: Normal effort and positive vesicular breath sounds. No respiratory distress. No wheezes, rales or ronchi noted.  Abdomen: Soft and nontender. Normal bowel sounds. No distention or masses noted. Liver, spleen and kidneys non palpable. Musculoskeletal: Normal range of motion. No signs of joint swelling. No difficulty with gait.  Neurological: Alert and oriented. Cranial nerves II-XII grossly intact. Coordination normal.  Psychiatric: Mood and affect normal. Behavior is normal. Judgment and thought content normal.   EKG:  BMET    Component Value Date/Time   NA 135 07/28/2017 1647   NA 140 07/09/2012 0445   K 4.0 07/28/2017 1647   K 3.8 07/09/2012 0445   CL 102 07/28/2017 1647   CL 107 07/09/2012 0445   CO2 25 07/28/2017 1647   CO2 24 07/09/2012 0445   GLUCOSE 79 07/28/2017 1647   GLUCOSE 107 (H) 07/09/2012 0445   BUN 10 07/28/2017 1647   BUN 12 07/09/2012 0445   CREATININE 0.70 07/28/2017 1647   CREATININE 0.72 07/09/2012 0445   CALCIUM 9.8 07/28/2017 1647   CALCIUM 9.1 07/09/2012 0445   GFRNONAA >60 06/12/2017 2323   GFRNONAA >60 07/09/2012 0445   GFRAA >60 06/12/2017 2323   GFRAA >60 07/09/2012 0445    Lipid Panel     Component Value Date/Time   CHOL 207 (H) 07/28/2017 1647   TRIG 200.0 (H) 07/28/2017 1647   HDL 51.90  07/28/2017 1647   CHOLHDL 4 07/28/2017 1647   VLDL 40.0 07/28/2017 1647   LDLCALC 115 (H) 07/28/2017 1647    CBC    Component Value Date/Time   WBC 9.2 07/28/2017 1647   RBC 4.65 07/28/2017 1647   HGB 12.2 07/28/2017 1647   HGB 14.4 07/09/2012 0445   HCT 37.5 07/28/2017 1647   HCT 41.8 07/09/2012 0445   PLT 489.0 (H) 07/28/2017 1647   PLT 321 07/09/2012 0445   MCV 80.6 07/28/2017 1647   MCV 85 07/09/2012 0445   MCH 28.3 06/12/2017 2323   MCHC 32.5 07/28/2017 1647   RDW 14.5 07/28/2017 1647   RDW 13.3 07/09/2012 0445   LYMPHSABS 3.9 (H) 06/12/2017 2323   MONOABS 0.5 06/12/2017 2323   EOSABS 0.1 06/12/2017 2323   BASOSABS 0.1 06/12/2017 2323    Hgb A1C Lab Results  Component Value Date   HGBA1C  5.4 07/28/2017           Assessment & Plan:   Preventative Health Maintenance:  Flu Tetanus COVID Pap smear Encouraged her to consume a balanced diet and exercise regimen Advised her to see an eye doctor and dentist annually Will check CBC, c-Met, lipid, A1c and hep C today  RTC in 1 year, sooner if needed Webb Silversmith, NP This visit occurred during the SARS-CoV-2 public health emergency.  Safety protocols were in place, including screening questions prior to the visit, additional usage of staff PPE, and extensive cleaning of exam room while observing appropriate contact time as indicated for disinfecting solutions.

## 2024-08-01 ENCOUNTER — Encounter: Payer: Self-pay | Admitting: Internal Medicine

## 2024-08-01 ENCOUNTER — Other Ambulatory Visit: Payer: Self-pay | Admitting: Medical Genetics

## 2024-08-04 ENCOUNTER — Other Ambulatory Visit: Payer: Self-pay

## 2024-08-11 ENCOUNTER — Other Ambulatory Visit
Admission: RE | Admit: 2024-08-11 | Discharge: 2024-08-11 | Disposition: A | Payer: Self-pay | Source: Ambulatory Visit | Attending: Medical Genetics | Admitting: Medical Genetics

## 2024-08-21 LAB — GENECONNECT MOLECULAR SCREEN: Genetic Analysis Overall Interpretation: NEGATIVE
